# Patient Record
Sex: Female | Born: 1985 | Race: White | Hispanic: No | Marital: Married | Smoking: Former smoker
Health system: Southern US, Community
[De-identification: ages and names within clinical notes are randomized; demographics above are authoritative.]

## PROBLEM LIST (undated history)

## (undated) DIAGNOSIS — Q6589 Other specified congenital deformities of hip: Secondary | ICD-10-CM

## (undated) HISTORY — DX: Other specified congenital deformities of hip: Q65.89

---

## 2012-05-17 NOTE — L&D Delivery Note (Signed)
PROCEDURE NOTE  Birth Date: 09/08/2014   Birth Time: 09/08/2012 1108  Patient Name: Schnepp,Xiamara  Gestational Age: [redacted]w[redacted]d  GsPs: [id number]    Procedure:   Spontaneous Vaginal Delivery    Brief Notes:   Rupture date: Spontaneous  Rupture time: 09/08/12  Fluid color: Clear    SVD of viable  female "Rulon Eisenmenger" from OA to ROT. Nuchal cord x 1 easily reduced. Shoulders delivered easily.  Baby to maternal abdomen with spontaneous cry. 3 vessel cord double clamped then cut by FOB "Kathlene November". Cord blood not obtained d/t maternal blood type. Schultz placenta delivered spontaneously, complete and intact with normal cord insertion.  Fundus firming to massage at u. Pitocin 30 units in 500cc's infusing for good uterine response.Marland Kitchen  Perineum inspected, intact.   Mom and baby stable and bonding. Count complete and correct    APGAR:   1 Minute Apgar:     8   5 Minute Apgar:     9       Birth Weight:     6 lb 15.8 oz (3170 g)     Placenta Delivery Date:     09/08/2012    Placenta Delivery Time:     11:10 AM     Estimated Blood Loss:   250    Additional Notes:   Patient delivered baby without pushing. Placenta also delivered on its on with no traction.   Gender was a surprise    Providers Performing:   Bunnie Philips      Signed by: Bunnie Philips

## 2012-09-08 ENCOUNTER — Inpatient Hospital Stay: Payer: BLUE CROSS/BLUE SHIELD | Admitting: Obstetrics & Gynecology

## 2012-09-08 ENCOUNTER — Inpatient Hospital Stay: Payer: BLUE CROSS/BLUE SHIELD | Admitting: Anesthesiology

## 2012-09-08 ENCOUNTER — Encounter: Payer: Self-pay | Admitting: Anesthesiology

## 2012-09-08 ENCOUNTER — Inpatient Hospital Stay
Admission: AD | Admit: 2012-09-08 | Discharge: 2012-09-09 | DRG: 775 | Disposition: A | Discharge status: Home / Self Care | Payer: BLUE CROSS/BLUE SHIELD | Source: Ambulatory Visit | Attending: Obstetrics & Gynecology | Admitting: Obstetrics & Gynecology

## 2012-09-08 DIAGNOSIS — Q6589 Other specified congenital deformities of hip: Secondary | ICD-10-CM

## 2012-09-08 DIAGNOSIS — O360990 Maternal care for other rhesus isoimmunization, unspecified trimester, not applicable or unspecified: Principal | ICD-10-CM | POA: Diagnosis present

## 2012-09-08 HISTORY — DX: Other specified congenital deformities of hip: Q65.89

## 2012-09-08 LAB — TYPE AND SCREEN
AB Screen Gel: NEGATIVE
ABO Rh: AB NEG

## 2012-09-08 LAB — CBC AND DIFFERENTIAL
Basophils Absolute Automated: 0.01 (ref 0.00–0.20)
Basophils Automated: 0 %
Eosinophils Absolute Automated: 0.08 (ref 0.00–0.70)
Eosinophils Automated: 1 %
Hematocrit: 38.4 % (ref 37.0–47.0)
Hgb: 12.5 g/dL (ref 12.0–16.0)
Immature Granulocytes Absolute: 0.04
Immature Granulocytes: 0 %
Lymphocytes Absolute Automated: 1.79 (ref 0.50–4.40)
Lymphocytes Automated: 17 %
MCH: 26.7 pg — ABNORMAL LOW (ref 28.0–32.0)
MCHC: 32.6 g/dL (ref 32.0–36.0)
MCV: 82.1 fL (ref 80.0–100.0)
MPV: 10.9 fL (ref 9.4–12.3)
Monocytes Absolute Automated: 0.92 (ref 0.00–1.20)
Monocytes: 9 %
Neutrophils Absolute: 7.81 (ref 1.80–8.10)
Neutrophils: 74 %
Nucleated RBC: 0 (ref 0–1)
Platelets: 276 (ref 140–400)
RBC: 4.68 (ref 4.20–5.40)
RDW: 15 % (ref 12–15)
WBC: 10.61 (ref 3.50–10.80)

## 2012-09-08 LAB — GONOCOCCUS CULTURE
Chlamydia trachomatis Culture: NEGATIVE
Culture Gonorrhoeae: NEGATIVE

## 2012-09-08 LAB — HEPATITIS B SURFACE ANTIGEN W/ REFLEX TO CONFIRMATION: Hepatitis B Surface Antigen: NEGATIVE

## 2012-09-08 LAB — RUBELLA ANTIBODY, IGG: Rubella AB, IgG: IMMUNE

## 2012-09-08 LAB — HIV AG/AB 4TH GENERATION: HIV 1/2 Antibody: NONREACTIVE

## 2012-09-08 LAB — RPR: RPR: NONREACTIVE

## 2012-09-08 LAB — GROUP B STREP TRANSCRIBED: GBS Transcribed: NEGATIVE

## 2012-09-08 MED ORDER — PRENATAL AD PO TABS
1.0000 | ORAL_TABLET | Freq: Every day | ORAL | Status: DC
Start: 2012-09-08 — End: 2012-09-09
  Administered 2012-09-09: 1 via ORAL
  Filled 2012-09-08: qty 1

## 2012-09-08 MED ORDER — LACTATED RINGERS IV SOLN
INTRAVENOUS | Status: DC
Start: 2012-09-08 — End: 2012-09-08

## 2012-09-08 MED ORDER — ONDANSETRON HCL 4 MG/2ML IJ SOLN
8.0000 mg | Freq: Three times a day (TID) | INTRAMUSCULAR | Status: DC | PRN
Start: 2012-09-08 — End: 2012-09-08

## 2012-09-08 MED ORDER — FAMOTIDINE 10 MG/ML IV SOLN
20.0000 mg | Freq: Once | INTRAVENOUS | Status: DC | PRN
Start: 2012-09-08 — End: 2012-09-08

## 2012-09-08 MED ORDER — OXYTOCIN 30UNITS IN LR 500 ML PP--OUTSOURCED
7.5000 [IU]/h | INTRAVENOUS | Status: DC
Start: 2012-09-08 — End: 2012-09-09

## 2012-09-08 MED ORDER — METHYLERGONOVINE MALEATE 0.2 MG PO TABS
0.2000 mg | ORAL_TABLET | Freq: Four times a day (QID) | ORAL | Status: AC | PRN
Start: 2012-09-08 — End: 2012-09-09
  Filled 2012-09-08: qty 1

## 2012-09-08 MED ORDER — METHYLERGONOVINE MALEATE 0.2 MG/ML IJ SOLN
200.0000 ug | Freq: Four times a day (QID) | INTRAMUSCULAR | Status: AC | PRN
Start: 2012-09-08 — End: 2012-09-09

## 2012-09-08 MED ORDER — ACETAMINOPHEN 500 MG PO TABS
1000.0000 mg | ORAL_TABLET | ORAL | Status: DC | PRN
Start: 2012-09-08 — End: 2012-09-08

## 2012-09-08 MED ORDER — IBUPROFEN 600 MG PO TABS
600.0000 mg | ORAL_TABLET | Freq: Four times a day (QID) | ORAL | Status: DC
Start: 2012-09-08 — End: 2012-09-09
  Administered 2012-09-08 – 2012-09-09 (×3): 600 mg via ORAL
  Filled 2012-09-08 (×4): qty 1

## 2012-09-08 MED ORDER — LIDOCAINE-EPINEPHRINE 1.5 %-1:200000 IJ SOLN
30.0000 mL | Freq: Once | INTRAMUSCULAR | Status: DC
Start: 2012-09-08 — End: 2012-09-08

## 2012-09-08 MED ORDER — MISOPROSTOL 200 MCG PO TABS
800.0000 ug | ORAL_TABLET | Freq: Once | ORAL | Status: DC
Start: 2012-09-08 — End: 2012-09-09

## 2012-09-08 MED ORDER — LIDOCAINE-EPINEPHRINE 1.5 %-1:200000 IJ SOLN
INTRAMUSCULAR | Status: AC
Start: 2012-09-08 — End: 2012-09-08
  Filled 2012-09-08: qty 1

## 2012-09-08 MED ORDER — RHO D IMMUNE GLOBULIN 300 MCG IM INJ
300.00 ug | INJECTION | Freq: Once | INTRAMUSCULAR | Status: AC
Start: 2012-09-08 — End: 2012-09-09
  Administered 2012-09-09: 300 ug via INTRAMUSCULAR
  Filled 2012-09-08: qty 300

## 2012-09-08 MED ORDER — CITRIC ACID-SODIUM CITRATE 334-500 MG/5ML PO SOLN
30.0000 mL | Freq: Once | ORAL | Status: DC | PRN
Start: 2012-09-08 — End: 2012-09-08

## 2012-09-08 MED ORDER — TETANUS-DIPHTH-ACELL PERTUSSIS 5-2.5-18.5 LF-MCG/0.5 IM SUSP
0.5000 mL | INTRAMUSCULAR | Status: DC | PRN
Start: 2012-09-08 — End: 2012-09-09
  Filled 2012-09-08: qty 0.5

## 2012-09-08 MED ORDER — OXYCODONE-ACETAMINOPHEN 5-325 MG PO TABS
2.0000 | ORAL_TABLET | Freq: Once | ORAL | Status: DC | PRN
Start: 2012-09-08 — End: 2012-09-09
  Filled 2012-09-08: qty 2

## 2012-09-08 MED ORDER — ONDANSETRON HCL 4 MG PO TABS
8.0000 mg | ORAL_TABLET | Freq: Three times a day (TID) | ORAL | Status: DC | PRN
Start: 2012-09-08 — End: 2012-09-08
  Filled 2012-09-08: qty 1

## 2012-09-08 MED ORDER — OXYCODONE-ACETAMINOPHEN 5-325 MG PO TABS
2.0000 | ORAL_TABLET | ORAL | Status: DC | PRN
Start: 2012-09-08 — End: 2012-09-09
  Administered 2012-09-08: 2 via ORAL

## 2012-09-08 MED ORDER — CALCIUM CARBONATE ANTACID 500 MG PO CHEW
1000.0000 mg | CHEWABLE_TABLET | Freq: Three times a day (TID) | ORAL | Status: DC | PRN
Start: 2012-09-08 — End: 2012-09-09

## 2012-09-08 MED ORDER — FENTANYL 2 MCG/ML+BUPIVACAINE 0.125% 100 ML
EPIDURAL | Status: DC
Start: 2012-09-08 — End: 2012-09-08

## 2012-09-08 MED ORDER — NALOXONE HCL 0.4 MG/ML IJ SOLN
0.1000 mg | INTRAMUSCULAR | Status: DC | PRN
Start: 2012-09-08 — End: 2012-09-08

## 2012-09-08 MED ORDER — OXYTOCIN 10 UNIT/ML IJ SOLN
10.0000 [IU] | Freq: Once | INTRAMUSCULAR | Status: DC | PRN
Start: 2012-09-08 — End: 2012-09-09

## 2012-09-08 MED ORDER — ONDANSETRON 4 MG PO TBDP
8.0000 mg | ORAL_TABLET | Freq: Three times a day (TID) | ORAL | Status: DC | PRN
Start: 2012-09-08 — End: 2012-09-09
  Administered 2012-09-08: 8 mg via ORAL
  Filled 2012-09-08: qty 2

## 2012-09-08 MED ORDER — EPHEDRINE SULFATE 50 MG/ML IJ SOLN
10.0000 mg | Freq: Once | INTRAMUSCULAR | Status: DC | PRN
Start: 2012-09-08 — End: 2012-09-08

## 2012-09-08 MED ORDER — WITCH HAZEL-GLYCERIN EX PADS
1.0000 | MEDICATED_PAD | CUTANEOUS | Status: DC | PRN
Start: 2012-09-08 — End: 2012-09-09
  Filled 2012-09-08 (×2): qty 40

## 2012-09-08 MED ORDER — LIDOCAINE HCL 2 % IJ SOLN
5.0000 mL | Freq: Once | INTRAMUSCULAR | Status: DC
Start: 2012-09-08 — End: 2012-09-09

## 2012-09-08 MED ORDER — OXYTOCIN 30UNITS IN LR 500 ML PP--OUTSOURCED
7.5000 [IU]/h | INTRAVENOUS | Status: AC
Start: 2012-09-08 — End: 2012-09-08

## 2012-09-08 MED ORDER — BENZOCAINE-MENTHOL 20-0.5 % EX AERO
1.0000 | INHALATION_SPRAY | CUTANEOUS | Status: DC | PRN
Start: 2012-09-08 — End: 2012-09-09
  Administered 2012-09-09: 1 via TOPICAL
  Filled 2012-09-08 (×2): qty 56

## 2012-09-08 MED ORDER — MEASLES, MUMPS & RUBELLA VAC SC INJ
0.5000 mL | INJECTION | SUBCUTANEOUS | Status: DC | PRN
Start: 2012-09-08 — End: 2012-09-09
  Filled 2012-09-08: qty 0.5

## 2012-09-08 MED ORDER — TERBUTALINE SULFATE 1 MG/ML IJ SOLN
0.2500 mg | Freq: Once | INTRAMUSCULAR | Status: DC | PRN
Start: 2012-09-08 — End: 2012-09-08

## 2012-09-08 MED ORDER — LANOLIN EX OINT
TOPICAL_OINTMENT | CUTANEOUS | Status: DC | PRN
Start: 2012-09-08 — End: 2012-09-09
  Filled 2012-09-08: qty 7

## 2012-09-08 MED ORDER — LIDOCAINE-EPINEPHRINE 1.5 %-1:200000 IJ SOLN
INTRAMUSCULAR | Status: DC | PRN
Start: 2012-09-08 — End: 2012-09-08
  Administered 2012-09-08 (×2): 5 mL via EPIDURAL

## 2012-09-08 MED ORDER — HYDROCORTISONE 1 % EX OINT
TOPICAL_OINTMENT | Freq: Three times a day (TID) | CUTANEOUS | Status: DC | PRN
Start: 2012-09-08 — End: 2012-09-09
  Filled 2012-09-08: qty 28.35

## 2012-09-08 MED ORDER — METOCLOPRAMIDE HCL 5 MG/ML IJ SOLN
10.0000 mg | Freq: Once | INTRAMUSCULAR | Status: DC | PRN
Start: 2012-09-08 — End: 2012-09-08

## 2012-09-08 MED ORDER — OXYTOCIN 30UNITS IN LR 500 ML PP--OUTSOURCED
INTRAVENOUS | Status: AC
Start: 2012-09-08 — End: 2012-09-08
  Filled 2012-09-08: qty 500

## 2012-09-08 MED ORDER — DOCUSATE SODIUM 100 MG PO CAPS
200.0000 mg | ORAL_CAPSULE | Freq: Two times a day (BID) | ORAL | Status: DC
Start: 2012-09-08 — End: 2012-09-09
  Administered 2012-09-08: 200 mg via ORAL
  Filled 2012-09-08: qty 2

## 2012-09-08 MED ORDER — LACTATED RINGERS IV BOLUS
1000.0000 mL | Freq: Once | INTRAVENOUS | Status: DC
Start: 2012-09-08 — End: 2012-09-08

## 2012-09-08 MED ORDER — FENTANYL 2 MCG/ML+BUPIVACAINE 0.125% 100 ML
EPIDURAL | Status: AC
Start: 2012-09-08 — End: 2012-09-08
  Administered 2012-09-08: 12 mL/h via EPIDURAL
  Filled 2012-09-08: qty 100

## 2012-09-08 MED ORDER — IBUPROFEN 600 MG PO TABS
600.0000 mg | ORAL_TABLET | Freq: Once | ORAL | Status: DC | PRN
Start: 2012-09-08 — End: 2012-09-09

## 2012-09-08 NOTE — Plan of Care (Signed)
Pt vital signs stable afebrile. Tolerating regular diet. Uterine cramping relieved with Percocet and ibuprofen.  Assisted with cross cradle hold for breastfeeding.

## 2012-09-08 NOTE — Anesthesia Procedure Notes (Signed)
Epidural    Patient location during procedure: L&D  Reason for block: maternal indication  Start time: 09/08/2012 9:39 AM  End time: 09/08/2012 9:55 AM  Staffing  Performed by: anesthesiologist Preanesthetic Checklist Completed: patient identified, surgical consent, pre-op evaluation, timeout performed, risks and benefits discussed, monitors and equipment checked, anesthesia consent given and correct site  Timeout Completed:  09/08/2012 9:35 AM    Epidural    Patient monitoring: Pulse oximetry and NIBP      Patient position: sitting  Sterile Technique: Betadine  Skin Local: lidocaine 1%      Interspace: L3-4  Number of attempts: 1    Approach: midline  Needle type: Touhy needle       Injection technique: LOR saline  Epidural Space ID: 6 cm        Needle type: Touhy needle                         Catheter at skin depth: 11 cm      Test Dose:1.5 % lidocaine      No Catheter IV/SA Signs or SymptomsAssessment   Block Outcome: patient tolerated procedure wellBlock at Surgeon's request: Yes

## 2012-09-08 NOTE — Anesthesia Preprocedure Evaluation (Signed)
Anesthesia Plan    ASA 3     epidural                                 informed consent obtained

## 2012-09-08 NOTE — Progress Notes (Signed)
Pt given regular tray- tol well- denies need for pain med @ this time

## 2012-09-08 NOTE — Progress Notes (Signed)
Pt states she has 'hip dislocations - and they are out all the time'

## 2012-09-08 NOTE — Progress Notes (Signed)
report to [employer] rn and transferred to rm 2014

## 2012-09-08 NOTE — H&P (Signed)
09/08/2012 9:21 AM     Deborah Warren is a 27 y.o. year old female who presents for evaluation to r/o labor.  Called [place], CNM c/o ctx q 10, instructed to continue to labor. Called 1 hour later stating ctx q 5, to L&D for eval. Denies bleeding, LOF, or decreased fetal movement.    History     Problems with present pregnancy:  Congenital hip displasia  BMI >35  RH negative     OB History:   OB History     Grav Para Term Preterm Abortions TAB SAB Ect Mult Living    3 1 1  1  1   1           Allergies: Review of patient's allergies indicates no known allergies.    Current medications: None    Gestational age: [redacted]w[redacted]d    Previous Pregnancy Problems:   Last baby 9#11ounces. No complications with delivery    Other significant prenatal, medical, or family history:    History reviewed. No pertinent past medical history.  History reviewed. No pertinent past surgical history.    Prenatal labs:  GBS negative  AB negative  Immune    Physician/CNM admission/Physical Examination     Vital Signs:   Filed Vitals:    09/08/12 0851   BP: 114/61   Pulse: 71   Temp: 98.2 F (36.8 C)   Resp: 20     General Appearance: Alert, appropriate appearance for age. Breathing and wincing with contractions    Membranes: intact    Cervical Exam:  Dilation: 5  Effacement (%): 80  Cervical Characteristics: Anterior  Presentation: Vertex  OB Examiner: Manoah Deckard    FHT: 130  Variability: moderate  Contractions: irregular, every 5 minutes  Categories: Category I    Proposed Plan of Care   NST  Admit to L&D  CBC and Type & Screen  Epidural PRN  Discussed plan of care with patient and FOB "Kathlene November".  Agree to plan of care.     Bunnie Philips, CNM   09/08/2012 9:21 AM

## 2012-09-08 NOTE — Progress Notes (Signed)
OB PROGRESS NOTE    Date Time: 09/08/2012 10:48 AM  Patient Name: Deborah Warren,Deborah Warren    Subjective:   After SROM, increase in pressure    Objective:     Filed Vitals:    09/08/12 1015   BP: 110/62   Pulse: 72   Temp:    Resp:    SpO2: 100%     Cervical Exam:  Dilation: Lip/rim (Comment)  Effacement (%): 80  Cervical Characteristics: Anterior  Presentation: Vertex  OB Examiner: j. Timotheus Salm     Membranes: ruptured, clear fluid  FHT: 130  Variability: moderate  Contractions: regular, every 2-3 minutes  Categories: Category I    Medications: Epidural    Assessment:   IUP at 40.5 weeks; Active labor    Plan:   Patient desires to rest now that she is comfortable and not yet feeling urge to push      Signed by: Bunnie Philips 09/08/2012

## 2012-09-09 LAB — RH IMM GLOBULIN

## 2012-09-09 LAB — FETAL BLEED SCREEN: Fetal Bleed Screen: NEGATIVE

## 2012-09-09 MED ORDER — IBUPROFEN 600 MG PO TABS
600.0000 mg | ORAL_TABLET | Freq: Four times a day (QID) | ORAL | Status: DC | PRN
Start: 2012-09-09 — End: 2020-10-09

## 2012-09-09 NOTE — Anesthesia Postprocedure Evaluation (Signed)
Anesthesia Post Evaluation    Patient: Deborah Warren    Procedures performed: * SVD *    Anesthesia type: Epidural    Patient location: 3 OB    Last vitals:   Filed Vitals:    09/09/12 0805   BP: 129/66   Pulse: 76   Temp: 99.8 F (37.7 C)   Resp: 18   SpO2:        Post pain: Patient not complaining of pain, continue current therapy      Mental Status:awake    Respiratory Function: tolerating room air    Cardiovascular: stable    Nausea/Vomiting: patient not complaining of nausea or vomiting    Hydration Status: adequate    Post assessment: no apparent anesthetic complications

## 2012-09-09 NOTE — Addendum Note (Signed)
Addendum  created 09/09/12 1443 by Bertha Stakes, MD    Modules edited:Notes Section

## 2012-09-09 NOTE — Discharge Instructions (Signed)
After Your Delivery Discharge Instructions    After Discharge Orders:  Follow up in 6 weeks     After your delivery - signs and symptoms to watch for:   Fever - Oral temperature greater than 100.4 degrees Fahrenheit   Foul-smelling vaginal discharge   Headache unrelieved by "pain meds"   Difficulty urinating   Breasts reddened, hard, hot to the touch   Nipple discharge which is foul-smelling or contains pus   Increased pain at the site of the laceration   Difficulty breathing with or without chest pain   New calf pain especially if only on one side   Sudden, continuing increased vaginal bleeding with or without clots   Unrelieved feelings of:   Inability to cope   Sadness   Anxiety   Lack of interest in baby   Insomnia   Crying     What to do at home:   See patient education handouts for full information   Resume activity gradually    Don't lift anything heavier than baby and carrier until OK'd by your Physician or Midwife   No sex until OK'd by your Physician or Midwife   Take care of yourself by sleeping/resting as much as possible   Eat regular nutritious meals   Let someone else care for you, your baby, and housework as much as possible    Take pain medication as prescribed whenever you need them   Wear compression stockings if prescribed    To avoid/relieve constipation take stool softeners if advised    Drink lots of water/fruit juices   Increase fiber in your diet   Breast care: Wear support bra 24/7; use Lanolin as needed     Refer to Newborn Discharge Instructions for any concerns

## 2012-09-09 NOTE — Plan of Care (Signed)
Pt vital signs stable afebrile. RN assisted with cross cradle hold for breastfeeding. Pt had nausea and vomiting yesterday evening, resolved; tolerating regular diet. Rhogam given prior to discharge. Pt given discharge instructions. Pt stated she understood. Discharged at this time, Pt to ,lobby via wheelchair accompanied by spouse and [facility] staff.

## 2012-09-09 NOTE — Discharge Summary (Signed)
[facility] POSTPARTUM DISCHARGE SUMMARY    Subjective:  Delivery Type: Vaginal  Minimal Bleeding, breastfeeding well  Baby doing well  Ready for discharge today    Objective:  Vital Signs Stable;   Filed Vitals:    09/08/12 2022   BP: 118/68   Pulse: 70   Temp: 98.4 F (36.9 C)   Resp: 18   SpO2:      Breast soft, non tender;   Fundus firm, non tender;  Extremities WNL    Assessment/Plan:  Stable - Postpartum Day 1  Discharge home  Follow up in 6 weeks  Call with fever, increasing and severe pain, heavy bleeding, depression, any questions or concerns  Take Ibuprofen PRN  PNV daily  Written and verbal instructions given    Signed by: Suzanna Obey, CNM 09/09/2012

## 2012-09-09 NOTE — Progress Notes (Signed)
09/09/12 0850   Lactation Consultation   Visit Type  Initial Assessment   Maternal Information   Breastfeeding Status YES   Previous Breastfeeding Experience Yes    In room visit. Mom holding sleeping baby. Mom is a P2. Mom had many struggles with breast feeding her first child. Mom newest son is latching and breast feeding much better. Mom is pleased. Discussed community resources available after D/C.

## 2012-09-10 ENCOUNTER — Other Ambulatory Visit: Payer: BLUE CROSS/BLUE SHIELD

## 2020-02-11 ENCOUNTER — Telehealth: Payer: BLUE CROSS/BLUE SHIELD | Admitting: Family Medicine

## 2020-02-11 ENCOUNTER — Encounter (INDEPENDENT_AMBULATORY_CARE_PROVIDER_SITE_OTHER): Payer: Self-pay | Admitting: Family Medicine

## 2020-02-11 ENCOUNTER — Telehealth (INDEPENDENT_AMBULATORY_CARE_PROVIDER_SITE_OTHER): Payer: Self-pay | Admitting: Family Medicine

## 2020-02-11 DIAGNOSIS — Z09 Encounter for follow-up examination after completed treatment for conditions other than malignant neoplasm: Secondary | ICD-10-CM

## 2020-02-11 DIAGNOSIS — J069 Acute upper respiratory infection, unspecified: Secondary | ICD-10-CM

## 2020-02-11 DIAGNOSIS — Q6589 Other specified congenital deformities of hip: Secondary | ICD-10-CM | POA: Insufficient documentation

## 2020-02-11 DIAGNOSIS — Z8709 Personal history of other diseases of the respiratory system: Secondary | ICD-10-CM

## 2020-02-11 NOTE — Patient Instructions (Signed)
Coronavirus Disease 2019 (COVID-19): Overview  Coronavirus disease 2019 (COVID-19) is a respiratory illness. It's caused by a new (novel) coronavirus. There are many types of coronavirus. Coronaviruses are a very common cause of colds and bronchitis. They may sometimes cause lung infection (pneumonia). Symptoms can range from mild to severe. Some people have no symptoms. These viruses are also found in some animals.  All 50 states in the [place] have reported cases of COVID-19. Many areas report "community spread" of COVID-19. This means the source of the illness is not known. Like other viruses, the virus that causes COVID-19 changes (mutates) all the time. This leads to variants. Many variants of the COVID-19 virus have been found across the world, including in the [place] COVID-19 variants may spread more easily from person to person. They may cause milder or more severe symptoms. COVID-19 is a rapidly-emerging infectious disease. This means that scientists are actively researching it. There are information updates regularly.  Public health officials are working to find the source. How the virus spreads is not yet fully understood, but it seems to spread and infect people fairly easily. Some people who have been infected in an area may not be sure how or where they were infected. The virus may be spread through droplets of fluid that a person coughs or sneezes into the air. It may be spread if you touch a surface with the virus on it, such as a handle or object, and then touch your eyes, nose, or mouth.     To help prevent spreading the infection, wash your hands often, or use an alcohol-based hand sanitizer.   For the latest information, visit the CDC website at www.cdc.gov/coronavirus/2019-ncov. Or call 800-CDC-INFO (800-232-4636).  What are the symptoms of COVID-19?  Some people have no symptoms or mild symptoms. Symptoms can also vary from person to person. As experts learn more about COVID-19, other symptoms are  being reported. Symptoms may appear 2 to 14 days after contact with the virus:  · Fever or chills  · Coughing  · Trouble breathing or feeling short of breath  · Sore throat  · Stuffy or runny nose  · Headache and body aches  · Fatigue  · Nausea, vomiting, diarrhea, or abdominal pain  · New loss of sense of smell or taste  You can check your symptoms with the CDC’s Coronavirus Self-Checker.  What are possible complications from COVID-19?  In many cases, this virus can cause infection (pneumonia) in both lungs. In some cases, this can cause death. Certain people are at higher risk for complications. This includes older adults and people with serious chronic health conditions such as heart or lung disease, diabetes, or kidney disease. It includes people with health conditions that suppress the immune system. And it includes people taking medicines that suppress the immune system.  As experts learn more about COVID-19, other complications are being reported that may be linked to COVID-19. Rarely, some children have developed severe complications called multisystem inflammatory syndrome in children (MIS-C). MIS-C seems to be similar to [place] disease, a rare condition causing inflammation of blood vessels and body organs. It's not yet known if MIS-C happens only in children, or if adults are also at risk. It's also not known if it's related to COVID-19, because many children, but not all, have tested positive for the virus. Experts continue to study MIS-C. The CDC advises healthcare providers to report to local health departments any person under age 21 years old who is ill enough to be in   the hospital and has all of the following:  · A fever over 100.4°F (38.0°C) for more than 24 hours and a positive SARS-CoV-2 test or exposure to the virus in the last 4 weeks  · Inflammation in at least 2 organs such as the heart, lungs, or kidneys with lab tests that show inflammation  · No other diagnoses besides COVID-19 explain  the child's symptoms  How is COVID-19 diagnosed?  Your healthcare provider will ask about your symptoms. He or she will ask where you live, and about your recent travel, and any contact with sick people. If your healthcare provider thinks you may have COVID-19, he or she will consider whether to test you for COVID-19. This depends on the availability of testing in your area, and how sick you are. Follow all instructions from your healthcare provider. Guidelines for testing may change as more information about the virus becomes available. Diagnostic tests are used to find a current COVID-19 infection. These include:  · Viral (molecular) test.  You may also hear this called a RT-PCR test. Viral tests are very accurate. A viral test looks for the SARS-CoV-2 virus's genetic material. A viral test can also detect COVID-19 variants. There are a few ways to do this. A nose-throat swab may be wiped inside your nose to the very back of your throat. Other tests are either done by nose or throat swab. Or a sample of your saliva may be taken. Availability of tests vary by location. Some test kits can be done at home but must be sent to a lab to be checked. Depending on the test, some results are back within about 30 minutes. Some tests must be sent to a lab and can take several days before the results are back. Home test kits are now available but vary by location, and some need a prescription. If you use a home kit, follow the instructions in the kit closely. Some kits get results quickly at home. Others must be sent to a lab for the results.  · Antigen test. This can find proteins from the SARS-CoV-2 virus. This is done by a nose or a nose-throat swab. Depending on the test, some results are back within an hour. Positive results are highly accurate, but false positives can happen, especially in places where few people have the virus. Antigen tests are more likely to miss a COVID-19 infection than a viral (molecular) test. If  your antigen test is negative but you have symptoms of COVID-19, your healthcare provider may order a viral test.  If your healthcare provider thinks or confirms that you have COVID-19, you may have other tests. These tests may include:  · Antibody blood test.  Antibody tests are being looked at to find out if a person has previously been infected with the virus and may now have antibodies such as SARS AB IgG in their blood to give some immunity. The accuracy and availability of antibody tests vary. An antibody test may not be able to show if you have a current infection because it can take up to a few weeks after infection to make antibodies. It's not yet known how long immunity lasts after being infected with the virus.  · Sputum culture.  A small sample of mucus coughed from your lungs (sputum) may be collected if you have a moist cough. It may be checked for the virus or to look for pneumonia.  · Imaging tests.  You may have a chest X-ray or CT scan.    Note about reinfection and your immunity  At this time, it's unclear if people can be reinfected with COVID-19. The CDC notes that if a person has fully recovered from COVID-19 and is retested within 3 months of the first infection, they may continue to have low levels of the virus in their body and test positive for COVID-19, even though they are not spreading COVID-19. Having a positive COVID-19 test after an infection doesn't mean you can't be reinfected. It's not yet known how long immunity lasts after being infected with the virus.  How is COVID-19 treated?  The FDA has approved vaccines to prevent COVID-19 in people older than 18 (one vaccine has been approved for people as young as 16). Pregnant or breastfeeding people may choose to be vaccinated. Expert groups, including ACOG and the CDC, advise pregnant or breastfeeding people to talk with their healthcare provider about the vaccine.  The vaccines are being rolled out to the public in phases. Check your  local health department for your community's roll-out plans. The vaccines are given as a shot (injection) in the arm muscle. A 1-dose or 2-dose vaccine may be given. If you get the 2-dose vaccine, the second dose is given several weeks after the first. Getting the COVID-19 vaccine is important to help prevent the spread of COVID-19 and its variants.  The most proven treatments right now are those to help your body while it fights the virus. This is known as supportive care. For serious COVID-19, you may need to stay in the hospital. Supportive care may include:  · Getting rest.  This helps your body fight the illness.  · Staying hydrated.  Drinking liquids is the best way to prevent dehydration. Try to drink 6 to 8 glasses of liquids every day, or as advised by your provider. Also check with your provider about which fluids are best for you. Don't drink fluids that contain caffeine or alcohol.  · Taking over-the-counter (OTC) pain medicine.  These are used to help ease pain and reduce fever. Follow your healthcare provider's instructions for which OTC medicine to use.  For severe illness, you may need to stay in the hospital. Care during severe illness may include:  · IV (intravenous) fluids.  These are given through a vein to help keep your body hydrated.  · Oxygen. You may be given supplemental oxygen or ventilation with a breathing machine (ventilator). This is done so you get enough oxygen in your body.  · Prone positioning.  Depending on how sick you are during your hospital stay, your healthcare team may turn you regularly on your stomach. This is called prone positioning. It helps increase the amount of oxygen you get to your lungs. Follow your healthcare team's instructions on position changes while you're in the hospital. Also follow their discharge advice on the best positions to help your breathing once you go home.  · Remdesivir. The FDA has approved an IV (intravenous) antiviral medicine called  remdesivir. It works by stopping the spread of the SARS-CoV-2 virus in the body. It's approved for people in the hospital. It's for people 12 years and older who weigh more than about 88 pounds (40 kgs). Remdesivir is approved only for people who need to be treated in the hospital. In certain cases, it may also be used for people younger than 12 years or who weigh less than about 88 pounds (40 kgs).  Research continues on other therapies that are still experimental. These include:  · COVID-19 convalescent plasma.    People who have had COVID-19 and are fully recovered may be asked by their healthcare team to consider donating plasma. This is called COVID-19 convalescent plasma donation. Plasma from people fully recovered from COVID-19 may contain antibodies to help fight COVID-19 in people who are currently seriously ill with the disease. Experts don't know if the donated plasma will work well as a treatment. Research continues, and the FDA has approved it for emergency use in certain people with serious or life-threatening COVID-19. Talk with your provider to learn more about convalescent plasma donation and whether you qualify to donate.  · Monoclonal antibody therapy.  The FDA recently approved this experimental therapy for emergency use for certain people who have a positive COVID-19 viral test and have mild to moderate symptoms but are not in the hospital. It's not widely available and is still being investigated. It's approved for people 12 years and older who weigh about 88 pounds (40 kgs) and are at high risk for severe COVID-19 and a hospital stay. This includes people who are 65 years and older and people with certain chronic conditions. Monoclonal antibody therapy is not approved for people who:  ? Are in the hospital with COVID-19, or  ? Need oxygen therapy for COVID-19,  or  ? Need oxygen therapy for a chronic condition and need to have their oxygen flow rate increased because of COVID-19.     Are you at  risk for COVID-19?  You are at risk for COVID-19 if you have had close contact with someone with the virus, or if you live in or traveled to an area with cases of it. Close contact means being within 6 feet of a person known to have COVID-19 for a total of 15 minutes or more. This could be multiple short encounters that add up to at least 15 minutes over a 24-hour period. Recent studies suggest that COVID-19 may be spread by people who are not showing symptoms.  Date last modified: 08/20/2019   [employer] last reviewed this educational content on 05/17/2018  © 2000-2021 [employer]. All rights reserved. This information is not intended as a substitute for professional medical care. Always follow your healthcare professional's instructions.

## 2020-02-11 NOTE — Progress Notes (Signed)
[facility]    Subjective:      Date: 02/11/2020 12:05 PM   Patient ID: Deborah Warren is a 34 y.o. female.    Verbal consent has been obtained from the patient to conduct a video and telephone visit: yes    Chief Complaint:  Chief Complaint   Patient presents with    Follow-up     COVID symptoms       HPI:  HPI  Son tested positive for COVID after exposure.  Today is her last day of quarantine period  Last week had symptoms (sneezing, congestion and sore throat, fatigue, sob, cough, fever, loss of sense of smell/taste), but now much better  Has not been tested because her son tested positive and she had similar symptoms.  Had discussed with health department  COVID vaccine status: UTD         Problem List:  Patient Active Problem List   Diagnosis    Congenital hip dysplasia       Current Medications:  Current Outpatient Medications   Medication Sig Dispense Refill    ibuprofen (ADVIL,MOTRIN) 600 MG tablet Take 1 tablet (600 mg total) by mouth every 6 (six) hours as needed for Fever (mild/moderate pain). 30 tablet 1     No current facility-administered medications for this visit.       Allergies:  No Known Allergies    Past Medical History:  Past Medical History:   Diagnosis Date    Congenital hip dysplasia        Past Surgical History:  History reviewed. No pertinent surgical history.    Family History:  History reviewed. No pertinent family history.    Social History:  Social History     Socioeconomic History    Marital status: Married     Spouse name: Not on file    Number of children: Not on file    Years of education: Not on file    Highest education level: Not on file   Occupational History    Not on file   Tobacco Use    Smoking status: Former Smoker     Quit date: 04/16/2006     Years since quitting: 13.8    Smokeless tobacco: Never Used    Tobacco comment: 0.5 PPD for 8 years   Substance and Sexual Activity    Alcohol use: No    Drug use: No    Sexual activity: Yes      Partners: Male   Other Topics Concern    Not on file   Social History Narrative    Not on file     Social Determinants of Health     Financial Resource Strain:     Difficulty of Paying Living Expenses:    Food Insecurity:     Worried About Programme researcher, broadcasting/film/video in the Last Year:     Barista in the Last Year:    Transportation Needs:     Freight forwarder (Medical):     Lack of Transportation (Non-Medical):    Physical Activity:     Days of Exercise per Week:     Minutes of Exercise per Session:    Stress:     Feeling of Stress :    Social Connections:     Frequency of Communication with Friends and Family:     Frequency of Social Gatherings with Friends and Family:     Attends Religious Services:     Active Member  of Clubs or Organizations:     Attends Engineer, structural:     Marital Status:    Intimate Partner Violence:     Fear of Current or Ex-Partner:     Emotionally Abused:     Physically Abused:     Sexually Abused:        The following sections were reviewed this encounter by the provider:   Tobacco   Allergies   Meds   Problems   Med Hx   Surg Hx   Fam Hx          ROS:  Review of Systems   Constitutional: Negative for chills, fatigue and fever.   HENT: Negative for congestion, ear pain, postnasal drip, rhinorrhea, sinus pain, sneezing and sore throat.    Respiratory: Negative for cough and shortness of breath.    Cardiovascular: Negative for chest pain.   Neurological: Negative for headaches.        Objective:     Physical Exam:  Physical Exam  Nursing note reviewed.   Constitutional:       Appearance: Normal appearance.   HENT:      Mouth/Throat:      Mouth: Mucous membranes are moist.   Eyes:      Extraocular Movements: Extraocular movements intact.   Pulmonary:      Effort: Pulmonary effort is normal.   Neurological:      Mental Status: She is alert.   Psychiatric:         Mood and Affect: Mood normal.         Assessment/Plan:       1. Acute URI  URI:  Status:  resolved  Treatment recommendations: Symptoms likely due to COVID due to her exposure to her son who tested positive. Answered questions regarding COVID-19 and CDC guidelines on isolation. She is on day 10 of isolation today and meets criteria to end isolation at the end of today.    Return in about 4 weeks (around 03/10/2020) for Annual Physical.    Virgilio Frees, MD

## 2020-02-11 NOTE — Telephone Encounter (Signed)
Appointment scheduled from MyChart   [name]   Sent: ZHY February 09, 2020 3:14 PM   To: Macky [facility] Desk    Jermeka Schlotterbeck   MRN: [id number] DOB: [birth year]   Pt Home: [phone]     Entered: [phone]        Message    Appointment For: Deborah Warren ([id number])   Visit Type: VIDEO VISIT NEW PATIENT [phone])      02/11/2020  11:45 AM 30 mins. Virgilio Frees, MD  [facility]      Patient Comments:   R: Illness - Other,Seen PCP:No, [place]:Yes, Appt Type:Video Visit, DOB:Aug 24, 1985, I: [employer] FEP (Basic),S: [phone], Language:N/A,    Appointment Scheduled    Mychart, Generic  Deborah Warren and proxy Leida Lauth) 23 hours ago (7:02 AM)   GM  Appointment Information:    Visit Type:     Video Visit  Provider:       Virgilio Frees, MD  Date:             02/11/20  Time:                      11:45 AM  Arrival Time:    11:30 AM EDT    Department:              [facility]    Appointment Instructions:     (Links will only appear as plain text. For optimal viewing of this message, please view your MyChart appointment details to access any active links)      Please read the detailed instructions on how to prepare for your video visit and to ensure that your device meets the technology requirements.      At least 24 hours before your appointment  Visit our  "Getting Started" section   View our instructional videos   If using a mobile device, ensure you have downloaded your MyChart app  Update your browser settings (see version information here.)  Charge your device    At least 15 minutes before your appointment time  Log into your MyChart account  Go to the Appointments tab   Select your video visit appointment   Complete the eCheck-in process  Click Begin Video Visit  When prompted, allow access to your camera and microphone  Click Join  Please wait for your provider and do not disconnect    If we  notice you are having problems connecting to your visit, a member from your care team may reach out to assist you.  They may contact you by phone, or they may send communication by email or text.      Thanks for scheduling your video visit with [facility]. We look forward to serving your healthcare needs.  [email or web address]  [email or web address]).  At [facility], safety is more than a checklist  its part of our culture, the foundation of everything we do, every time, every touch.    Additional Patient Resources   Read our Patient Acknowledgement   If you have any questions prior to your visit, please send a message via MyChart account or call the clinic.             Please click [email or web address] to view more details about your appointment.         This MyChart message has not been read.   Mychart, Generic  Deborah Warren and proxy SHERLENE RICKEL) 23 hours ago (7:01 AM)  GM  Appointment Information:    Visit Type:     Video Visit  Provider:       Virgilio Frees, MD  Date:             02/11/20  Time:                      11:45 AM  Arrival Time:    11:30 AM EDT    Department:              [facility]  [address]  [place] [address]  Dept: [phone]  Dept Fax: [phone]      Please click [email or web address] to review and complete any questionnaires that are available before your appointment.     Mychart, Generic  Deborah Warren 2 days ago   GM  Appointment Information:    Visit Type:     Video Visit  Provider:     Virgilio Frees, MD  Date:         02/11/20  Time:           11:45 AM  Arrival Time:    11:30 AM EDT    Department:           [facility]    Appointment Instructions:     (Links will only appear as plain text. For optimal viewing of this message,   please view your MyChart appointment details to access any active links)      Please read the detailed instructions  on how to prepare for your video visit   and to ensure that your device meets the technology requirements.      At least 24 hours before your appointment  Visit our  "Getting Started" section   View our instructional videos   If using a mobile device, ensure you have downloaded your MyChart app  Update your browser settings (see version information here.)  Charge your device    At least 15 minutes before your appointment time  Log into your MyChart account  Go to the Appointments tab   Select your video visit appointment   Complete the eCheck-in process  Click Begin Video Visit  When prompted, allow access to your camera and microphone  Click Join  Please wait for your provider and do not disconnect    If we notice you are having problems connecting to your visit, a member from   your care team may reach out to assist you.  They may contact you by phone, or   they may send communication by email or text.      Thanks for scheduling your video visit with [place]. We look forward to serving   your healthcare needs.  [email or web address]  [email or web address]).  At   [facility], safety is more than a checklist  its part of our culture, the   foundation of everything we do, every time, every touch.    Additional Patient Resources   Read our Patient Acknowledgement   If you have any questions prior to your visit, please send a message via   MyChart account or call the clinic.             Please click [email or web address] to view more details about your   appointment.         This MyChart message has not been read.   Deborah Warren  Patient Appointment Schedule Request Pool 2 days ago   JC  Appointment For:  Deborah Warren ([id number])  Visit Type: VIDEO VISIT NEW PATIENT [phone])    02/11/2020   11:45 AM  30 mins.  Virgilio Frees, MD    [facility]    Patient Comments:  R: Illness - Other,Seen PCP:No, [place]:Yes, Appt Type:Video Visit, DOB:07/16/1985, I:  [employer] FEP (Basic),S: [phone], Language:N/A,

## 2020-02-15 ENCOUNTER — Telehealth (INDEPENDENT_AMBULATORY_CARE_PROVIDER_SITE_OTHER): Payer: BLUE CROSS/BLUE SHIELD | Admitting: Internal Medicine

## 2020-05-14 NOTE — Progress Notes (Signed)
Subjective:      Patient ID: Deborah Warren is a 34 y.o. female.    Chief Complaint:  Chief Complaint   Patient presents with    Annual Exam     Patient is fasting       HPI  Visit Type: Health Maintenance Visit  Work Status: working full-time   Reported Health: good health  Reported Diet: compliant with well-balanced diet  Reported Exercise: recently not as regularly  Dental: regular dental visits twice a year  Vision: glasses and regular eye exams   Hearing: normal hearing  Immunization Status: Tdap vaccination due  Reproductive Health: sexually active, premenopausal and LNMP:  April 28, 2020  Prior Screening Tests: pap smear within past 3-5 years  General Health Risks: no family history of colon cancer and no family history of breast cancer  Safety Elements Used: uses seat belts, smoke detectors in household, does not text and drive, guns at home, gun safe and gun locks  Depression Screening: Little interest or pleasure in doing things: 0 (05/15/2020 12:01 AM)  Feeling down, depressed, or hopeless: 0 (05/15/2020 12:01 AM)  PHQ Total Score: 0 (05/15/2020 12:01 AM)      Problem List:  Patient Active Problem List   Diagnosis    Congenital hip dysplasia    Class 2 obesity due to excess calories without serious comorbidity with body mass index (BMI) of 39.0 to 39.9 in adult       Current Medications:  No outpatient medications have been marked as taking for the 05/15/20 encounter (Office Visit) with Virgilio Frees, MD.       Allergies:  No Known Allergies    Past Medical History:  Past Medical History:   Diagnosis Date    Congenital hip dysplasia        Past Surgical History:  History reviewed. No pertinent surgical history.    Family History:  Family History   Problem Relation Age of Onset    No known problems Mother     Brain Aneurysm Father        Social History:  Social History     Tobacco Use    Smoking status: Former Smoker     Quit date: 04/16/2006     Years since quitting: 14.0    Smokeless  tobacco: Never Used    Tobacco comment: 0.5 PPD for 8 years   Vaping Use    Vaping Use: Never used   Substance Use Topics    Alcohol use: No    Drug use: No         The following sections were reviewed this encounter by the provider:   Tobacco   Allergies   Meds   Problems   Med Hx   Surg Hx   Fam Hx            Review of Systems   Constitutional: Negative for chills, fatigue and fever.   HENT: Negative for congestion, ear pain, rhinorrhea and sore throat.    Eyes: Negative for visual disturbance.   Respiratory: Negative for cough, shortness of breath and wheezing.    Cardiovascular: Negative for chest pain, palpitations and leg swelling.   Gastrointestinal: Negative for abdominal pain, blood in stool, diarrhea, nausea and vomiting.   Genitourinary: Negative for difficulty urinating, dysuria, hematuria, menstrual problem, vaginal bleeding, vaginal discharge and vaginal pain.   Musculoskeletal: Negative for arthralgias and myalgias.   Skin: Negative for rash.   Neurological: Negative for dizziness, weakness, light-headedness, numbness and  headaches.   Hematological: Negative for adenopathy.   Psychiatric/Behavioral: Negative for confusion.        Objective:   Vitals:  BP 116/60 (BP Site: Left arm, Patient Position: Sitting, Cuff Size: Medium)    Pulse 69    Temp 97.7 F (36.5 C) (Temporal)    Ht 1.54 m (5' 0.63")    Wt 94.4 kg (208 lb 3.2 oz)    LMP 04/28/2020 (Exact Date)    SpO2 99%    Breastfeeding Yes    BMI 39.82 kg/m     Physical Exam  Vitals reviewed.   Constitutional:       Appearance: Normal appearance. She is obese.   HENT:      Head: Normocephalic.      Nose: Nose normal.      Mouth/Throat:      Mouth: Mucous membranes are moist.   Eyes:      Extraocular Movements: Extraocular movements intact.      Pupils: Pupils are equal, round, and reactive to light.   Cardiovascular:      Rate and Rhythm: Normal rate and regular rhythm.      Pulses: Normal pulses.      Heart sounds: Normal heart sounds. No murmur  heard.      Pulmonary:      Effort: Pulmonary effort is normal. No respiratory distress.      Breath sounds: Normal breath sounds. No wheezing or rales.   Abdominal:      General: Bowel sounds are normal. There is no distension.      Palpations: Abdomen is soft.      Tenderness: There is no abdominal tenderness.   Musculoskeletal:         General: Normal range of motion.      Cervical back: Normal range of motion.   Lymphadenopathy:      Cervical: No cervical adenopathy.   Skin:     General: Skin is warm.   Neurological:      General: No focal deficit present.      Mental Status: She is alert.      Cranial Nerves: No cranial nerve deficit.   Psychiatric:         Mood and Affect: Mood normal.          Assessment/Plan:       1. Annual physical exam    2. Class 2 obesity due to excess calories without serious comorbidity with body mass index (BMI) of 39.0 to 39.9 in adult    3. Screening for diabetes mellitus  - Comprehensive metabolic panel    4. Screening for hyperlipidemia  - Lipid panel    5. Immunization due  - Tdap vaccine greater than or equal to 7yo IM      Health Maintenance:  High BMI follow-up: Encouragement to Exercise. Recommend optimizing low carbohydrate diet efforts and obtaining at least 150 minutes of aerobic exercise per week. Recommend 20-25 grams of dietary fiber daily. Recommend drinking at least 60-80 ounces of water per day.  Vision screening UTD. Dental Screening UTD. Cervical cancer screening is UTD. Gynecologist surveillance is UTD.    Return in about 1 year (around 05/15/2021) for Annual Physical.    Virgilio Frees, MD

## 2020-05-15 ENCOUNTER — Ambulatory Visit (INDEPENDENT_AMBULATORY_CARE_PROVIDER_SITE_OTHER): Payer: BLUE CROSS/BLUE SHIELD | Admitting: Family Medicine

## 2020-05-15 ENCOUNTER — Encounter (INDEPENDENT_AMBULATORY_CARE_PROVIDER_SITE_OTHER): Payer: Self-pay | Admitting: Family Medicine

## 2020-05-15 VITALS — BP 116/60 | HR 69 | Temp 97.7°F | Ht 60.63 in | Wt 208.2 lb

## 2020-05-15 DIAGNOSIS — E6609 Other obesity due to excess calories: Secondary | ICD-10-CM | POA: Insufficient documentation

## 2020-05-15 DIAGNOSIS — Z131 Encounter for screening for diabetes mellitus: Secondary | ICD-10-CM

## 2020-05-15 DIAGNOSIS — Z23 Encounter for immunization: Secondary | ICD-10-CM

## 2020-05-15 DIAGNOSIS — Z0000 Encounter for general adult medical examination without abnormal findings: Secondary | ICD-10-CM

## 2020-05-15 DIAGNOSIS — Z6839 Body mass index (BMI) 39.0-39.9, adult: Secondary | ICD-10-CM

## 2020-05-15 DIAGNOSIS — Z13220 Encounter for screening for lipoid disorders: Secondary | ICD-10-CM

## 2020-05-15 LAB — LIPID PANEL
Cholesterol / HDL Ratio: 2.9
Cholesterol: 194 mg/dL (ref 0–199)
HDL: 68 mg/dL (ref 40–9999)
LDL Calculated: 115 mg/dL — ABNORMAL HIGH (ref 0–99)
Triglycerides: 57 mg/dL (ref 34–149)
VLDL Calculated: 11 mg/dL (ref 10–40)

## 2020-05-15 LAB — COMPREHENSIVE METABOLIC PANEL
ALT: 23 U/L (ref 0–55)
AST (SGOT): 17 U/L (ref 5–34)
Albumin/Globulin Ratio: 1.3 (ref 0.9–2.2)
Albumin: 4.1 g/dL (ref 3.5–5.0)
Alkaline Phosphatase: 39 U/L (ref 37–117)
Anion Gap: 9 (ref 5.0–15.0)
BUN: 16 mg/dL (ref 7.0–19.0)
Bilirubin, Total: 0.4 mg/dL (ref 0.2–1.2)
CO2: 25 mEq/L (ref 21–29)
Calcium: 8.8 mg/dL (ref 8.5–10.5)
Chloride: 106 mEq/L (ref 100–111)
Creatinine: 0.7 mg/dL (ref 0.4–1.5)
Globulin: 3.2 g/dL (ref 2.0–3.7)
Glucose: 98 mg/dL (ref 70–100)
Potassium: 4.6 mEq/L (ref 3.5–5.1)
Protein, Total: 7.3 g/dL (ref 6.0–8.3)
Sodium: 140 mEq/L (ref 136–145)

## 2020-05-15 LAB — HEMOLYSIS INDEX: Hemolysis Index: 7 (ref 0–24)

## 2020-05-15 LAB — GFR: EGFR: >60

## 2020-05-15 NOTE — Patient Instructions (Signed)
Prevention Guidelines, Women Ages 18 to 39  Screening tests and vaccines are an important part of managing your health. A screening test is done to find possible disorders or diseases in people who don't have any symptoms. The goal is to find a disease early so lifestyle changes can be made and you can be watched more closely to reduce the risk of disease, or to detect it early enough to treat it most effectively. Screening tests are not considered diagnostic, but are used to determine if more testing is needed. Health counseling is essential, too. Below are guidelines for these, for women ages 18 to 39. Talk with your healthcare provider to make sure you’re up-to-date on what you need.   We understand gender is a spectrum. We may use gendered terms to talk about anatomy and health risk. Please use this sheet in a way that works best for you and your provider as you talk about your care.   Screening  Who needs it  How often    Alcohol misuse All women in this age group  At routine exams   Blood pressure All women in this age group  Yearly checkup if your blood pressure is normal   Normal blood pressure is less than 120/80 mm Hg   If your blood pressure reading is higher than normal, follow the advice of your healthcare provider    Breast cancer All women in this age group should talk with their healthcare providers about the need for clinical breast exams (CBE)1  Clinical breast exam every 3 years    Cervical cancer Women ages 21 and older  Women between ages 21 and 29 should have a Pap test every 3 years; women between ages 30 and 65 are advised to have a Pap test plus an HPV test every 5 years    Chlamydia Sexually active women ages 24 and younger, and women at increased risk for infection  Every 3 years if you're at risk or have symptoms    Depression All women in this age group  At routine exams   Diabetes mellitus, type 2  Adults with no symptoms who are overweight or obese and have 1 or more other risk  factors for diabetes  At least every 3 years. Also, testing for diabetes during pregnancy after the 24th week.     Gonorrhea Sexually active women at increased risk for infection  At routine exams   Hepatitis C Anyone at increased risk  At routine exams   HIV All women At routine exams  and in all pregnant people    Obesity All women in this age group  At routine exams   Syphilis Women at increased risk for infection should talk with their healthcare provider  At routine exams   Tuberculosis Women at increased risk for infection should talk with their healthcare provider  Ask your healthcare provider    Vision All women in this age group  At least 1 complete exam in your 20s, and 2 in your 30s    Vaccine  Who needs it  How often    Chickenpox (varicella)  All women in this age group who have no record of this infection or vaccine  2 doses; the second dose should be given 4 to 8 weeks after the first dose    Hepatitis A Women at increased risk for infection should talk with their healthcare provider  2 doses given at least 6 months apart    Hepatitis B Women at increased risk   for infection should talk with their healthcare provider. Pregnant people should be screened for hepatitis B virus at their first prenatal visit.  3 doses over 6 months; second dose should be given 1 month after the first dose; the third dose should be given at least 2 months after the second dose and at least 4 months after the first dose    Haemophilus influenzae type B (HIB)  Women at increased risk for infection should talk with their healthcare provider  1 to 3 doses   Human papillomavirus (HPV)  All women in this age group up to age 45  3 doses; the second dose should be given 1 to 2 months after the first dose and the third dose given 6 months after the first dose    Influenza (flu) All women in this age group  Once a year   COVID-19 All women in this age group  1 to 2 doses depending on vaccine; talk with your healthcare provider     Measles, mumps, rubella (MMR)  All women in this age group who have no record of these infections or vaccines  1 or 2 doses   Meningococcal Women at increased risk for infection should talk with their healthcare provider  1 or more doses   Pneumococcal conjugate vaccine (PCV13) and pneumococcal polysaccharide vaccine (PPSV23)  Women at increased risk for infection should talk with their healthcare provider  PCV13: 1 dose ages 19 to 65 (protects against 13 types of pneumococcal bacteria)   PPSV23: 1 to 2 doses through age 64, or 1 dose at 65 or older (protects against 23 types of pneumococcal bacteria)    Tetanus/diphtheria/pertussis (Td/Tdap) booster All women in this age group  1 dose Tdap, then Td or Tdap booster every 10 years    Counseling  Who needs it  How often    BRCA gene mutation testing for breast and ovarian cancer susceptibility  Women with increased risk for having gene mutation  When your risk is known    Breast cancer and chemoprevention  Women at high risk for breast cancer  When your risk is known    Diet and exercise Women who are overweight or obese  When diagnosed, and then at routine exams    Domestic violence All women in this age group  Every visit   Sexually transmitted infection prevention  Women who are sexually active  At routine exams   Skin cancer Prevention of skin cancer in fair-skinned adults  At routine exams   Use of tobacco and the health effects it can cause  All women in this age group  Every visit   According to the ACS, women ages 20 to 39 years should have a clinical breast exam (CBE) as part of their routine health exam every 3 years. Breast self-exams are an option for women starting in their 20s. But the U.S. Preventive Services Task Force (USPSTF) does not recommend CBE.   Those who are 36 years old and not up-to-date on their childhood vaccines should get all appropriate catch-up vaccines recommended by the CDC.   The USPSTF recommends that all people ages 15 to 65  years be screened for HIV and those younger or older people at increased risk. The CDC recommends that everyone between the ages of 13 and 64 get tested for HIV at least once as part of routine health care.   [employer] last reviewed this educational content on 10/16/2019  © 2000-2021 [employer]. All rights reserved. This information is not intended   as a substitute for professional medical care. Always follow your healthcare professional's instructions.

## 2020-05-15 NOTE — Progress Notes (Signed)
Have you seen any specialists/other providers since your last visit with [place]?    Yes    Arm preference verified?   Yes    The patient is due for pap smear and Covid-19 vaccine

## 2020-10-03 ENCOUNTER — Telehealth (INDEPENDENT_AMBULATORY_CARE_PROVIDER_SITE_OTHER): Payer: BLUE CROSS/BLUE SHIELD | Admitting: Family Medicine

## 2020-10-03 DIAGNOSIS — S73004D Unspecified dislocation of right hip, subsequent encounter: Secondary | ICD-10-CM

## 2020-10-03 DIAGNOSIS — S73005D Unspecified dislocation of left hip, subsequent encounter: Secondary | ICD-10-CM

## 2020-10-03 DIAGNOSIS — G8929 Other chronic pain: Secondary | ICD-10-CM

## 2020-10-03 DIAGNOSIS — M25552 Pain in left hip: Secondary | ICD-10-CM

## 2020-10-03 MED ORDER — GABAPENTIN 100 MG PO CAPS
100.0000 mg | ORAL_CAPSULE | Freq: Three times a day (TID) | ORAL | 1 refills | Status: DC
Start: 2020-10-03 — End: 2023-05-10

## 2020-10-03 NOTE — Progress Notes (Signed)
Subjective:       Patient ID: Deborah Warren is a 35 y.o. female.  Verbal consent has been obtained from the patient to conduct a video and telephone: yes    Hip Pain   Injury mechanism: has h/o congenital dislocation. The pain is present in the left hip. The quality of the pain is described as shooting. The pain is at a severity of 8/10. The pain is moderate. The pain has been intermittent since onset. Pertinent negatives include no numbness or tingling. Associated symptoms comments: radiating to leg. Exacerbated by: suddenly has shotting pain, more so after she slept for few hours wakes her up in severe pain. She has tried ice, heat and NSAIDs for the symptoms. The treatment provided no relief.     Pt has h/o hip dislocation-congenital disorder. Diagnosed at age of 5,since then on and off hip pain- muscle pain.  The following portions of the patient's history were reviewed and updated as appropriate: allergies, current medications, past family history, past medical history, past social history, past surgical history and problem list.    Review of Systems   Constitutional: Negative for appetite change, chills and fever.   Musculoskeletal: Positive for arthralgias.   Neurological: Negative for tingling and numbness.           Objective:    Physical Exam  Constitutional:       Appearance: Normal appearance.   HENT:      Head: Normocephalic and atraumatic.   Pulmonary:      Effort: Pulmonary effort is normal.   Neurological:      Mental Status: She is alert.             Assessment:       1. Chronic left hip pain  Sports Medicine Referral: Deborah Hearing, MD   2. Bilateral hip dislocation, subsequent encounter  Sports Medicine Referral: Deborah Hearing, MD           Plan:      Procedures  No orders of the defined types were placed in this encounter.      Hip pain- acute on chronic  ?etiolog-has h/o ship dislocation.  Advise tos ee ortho.  Start gabapentin to help with nerve pain.  Risk & Benefits of the new  medication(s) were explained to the pt (and family) who appeared to understand & agree to the treatment plan.  Time spent in discussion: 20 minutes

## 2020-10-04 ENCOUNTER — Encounter (INDEPENDENT_AMBULATORY_CARE_PROVIDER_SITE_OTHER): Payer: Self-pay | Admitting: Family Medicine

## 2020-10-09 ENCOUNTER — Telehealth: Payer: BLUE CROSS/BLUE SHIELD

## 2020-10-09 ENCOUNTER — Ambulatory Visit (INDEPENDENT_AMBULATORY_CARE_PROVIDER_SITE_OTHER): Payer: BLUE CROSS/BLUE SHIELD

## 2020-10-09 ENCOUNTER — Ambulatory Visit (INDEPENDENT_AMBULATORY_CARE_PROVIDER_SITE_OTHER): Payer: BLUE CROSS/BLUE SHIELD | Admitting: Orthopaedic Surgery

## 2020-10-09 ENCOUNTER — Encounter (INDEPENDENT_AMBULATORY_CARE_PROVIDER_SITE_OTHER): Payer: Self-pay | Admitting: Orthopaedic Surgery

## 2020-10-09 VITALS — BP 110/75 | HR 80 | Ht 61.0 in | Wt 200.0 lb

## 2020-10-09 DIAGNOSIS — M25559 Pain in unspecified hip: Secondary | ICD-10-CM

## 2020-10-09 MED ORDER — METHYLPREDNISOLONE 4 MG PO TBPK
ORAL_TABLET | ORAL | 0 refills | Status: AC
Start: 2020-10-09 — End: 2020-10-15

## 2020-10-09 NOTE — Progress Notes (Signed)
[facility]    Provider: Marjory Sneddon, MD  Date of Exam:  10/09/2020   Patient:  Deborah Warren  DOB:  1985/10/29    AGE:  35 y.o.  MR#:  [id number]     Chief Complaint: Right hip pain.    HPI: Sibbie is a pleasant 35 y.o. female who presents with bilateral congenital hip dislocations.  She has been dealing with this new complaint of intense, severe, radiating pain from the left buttock to the left calf for about 3 weeks.  Insidious onset of new symptoms.  She describes it as surging pain down the posterior left lower extremity.  She explains that she is used to the feeling of bilateral muscular and joint pain however, this feels different.  For other past medical history, social history, family history and past surgical history please see them listed below.        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 without serious comorbidity with body mass index (BMI) of 39.0 to 39.9 in adult        Past Medical History:   Past Medical History:   Diagnosis Date    Congenital hip dysplasia        Social History:   Social History     Tobacco Use    Smoking status: Former Smoker     Quit date: 04/16/2006     Years since quitting: 14.4    Smokeless tobacco: Never Used    Tobacco comment: 0.5 PPD for 8 years   Vaping Use    Vaping Use: Never used   Substance Use Topics    Alcohol use: No    Drug use: No       Family History:   Family History   Problem Relation Age of Onset    No known problems Mother     Brain Aneurysm Father        Past Surgical History: History reviewed. No pertinent surgical history.    Medications:   Current Outpatient Medications:     gabapentin (NEURONTIN) 100 MG capsule, Take 1 capsule (100 mg total) by mouth 3 (three) times daily, Disp: 30 capsule, Rfl: 1    ibuprofen (ADVIL) 400 MG tablet, Take 400 mg by mouth every 6 (six) hours as needed for Pain PRN, Disp: , Rfl:     Allergies: No Known  Allergies    ROS:  Constitutional: No fatigue, fever, weight loss, or weight gain.   Ears, Nose, Mouth & Throat: No sore throat or hearing loss.   Cardiovascular: No chest pain, blood clots, or leg cramps.   Respiratory: No shortness of breath, cough, or difficulty breathing.   Gastrointestinal: No nausea, vomiting, diarrhea, or loss of appetite.   Genitourinary: No polyuria or kidney disease.   Musculoskeletal: No joint aches, muscle weakness or swelling of joints/body parts other than that mentioned above/below.  Integumentary: No finger nail changes or skin dryness.   Neurological: No numbness, burning discomfort, or headaches.   Psychiatric: No depression or anxiety.   Endocrine: No increased thirst, change in appetite or thyroid disease.   Hematologic/Lymphatic: No easy bruising or anemia.     Exam:   Left Hip Exam:    This is a well-appearing patient in no acute distress and ambulates with normal gait and station. There are no obvious deformities or abnormalities of the LE.    No  lumbar or sacral tenderness.   No tenderness to palpation of ASIS,  Iliac Crest    TTP proximal IT Band, greater trochanter , PSIS  Range of motion of hip flexion/extension is 0-140 degrees, internal rotation to 10 degrees w/ pain, external rotation of 50 degrees.   Patient is able to SLR but passive SLR elicits severe pain in the glute to posterior thigh at 30 degrees.   Straight leg raise is normal   Bilaterally 5/5 EHL, FHL, anterior tibialis, gastroc/soleus, quads and hamstrings    Sensation is normal through L1-S1 distribution   The patient is otherwise neurovascularly intact    Right Hip Exam:       this is a well-appearing patient in no acute distress and ambulates with normal gait and station. There are no obvious deformities or abnormalities of the LE.    No lumbar or sacral tenderness.   No tenderness to palpation of ASIS, PSIS, Iliac Crest    TTP proximal IT Band, greater trochanter   Range of motion of hip  flexion/extension is 0-140 degrees, internal rotation to 10 degrees w/ pain, external rotation of 50 degrees.   Straight leg raise is normal   Bilaterally 5/5 EHL, FHL, anterior tibialis, gastroc/soleus, quads and hamstrings    Sensation is normal through L1-S1 distribution   The patient is otherwise neurovascularly intact       Vitals: BP 110/75    Pulse 80    Ht 1.549 m (5\' 1" )    Wt 90.7 kg (200 lb)    BMI 37.79 kg/m     General: Maryhelen was awake, alert, oriented, easily engaged, displayed logical thinking with clear speech and was neat in appearance. Her general appearance was normal, well-developed and well-nourished.    Gait: The patient demonstrated mildly antalgic gait with intact coordination and balance.     Studies: AP pelvis, modified dunn lateral, frog leg lateral and false profile views of the left hip were ordered and reviewed on today's visit.  X-rays demonstrate bilateral congenital hip dislocations.  No acute bony process, fracture, dislocation.    Assessment/Plan: Jermeka is a pleasant 35 y.o. 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  The patient's diagnosis and treatment options were discussed in detail at today's visit.  We talked about treatment options including acutely she will try a Medrol Dosepak to reduce the symptoms of the sciatic nerve. We reviewed the standard conservative and surgical management of her diagnosis as well as the use of medications such as Tylenol and NSAIDS.  We discussed the limited role of injection therapy in management and its potential role in diagnosis and short term pain relief.  The potential need for surgical intervention was described in detail including the procedure, risks, rehab and recovery.  All questions were answered to the patient's satisfaction.  The patient will notify my clinic of any changes or worsening of their symptoms during the interim.   We will plan on follow up as needed.     Elements of MDM:   1. Number and Complexity of Problems Addressed: E&MData4: Chronic with Exacerbation    2. Data Reviewed: (need A, B, or C)  A) Independent Radiology interpretation: Yes XR hip     B) (need 3):   New Radiology/Labs ordered: Yes xr hip   Outside Radiology reports/Labs reviewed: No  Involve Independent Historian: No  External provider notes reviewed: No      C)  Discussion with External provider: No    3. Risk of Problem/Management: Moderate risk from additional diagnostic testing or treatment   (need 1)  Closed Fracture Care: No    Medication Prescriptions Given: Yes  Minor Surgery/Injections with Risk Factors Discussed: Yes  Major Surgery without Risk Factors Discussed: Yes    --------------------------------------------------    Level of Medical Decision Making: Moderate - Level 4 Visit  (Need 2 of 3 categories above)  I, Marjory Sneddon, MD, have personally performed the history, physical exam and medical decision making; and confirmed the accuracy of the information in the transcribed note.    Date: 10/09/2020 Time: 1:05 PM    The review of the patient's medications does not in any way constitute an endorsement, by this clinician,  of their use, dosage, indications, route, efficacy, interactions, or other clinical parameters.    This note may have been generated in part or in whole within the [employer] EMR using Dragon medical speech recognition software and may contain inherent errors or omissions not intended by the user. Grammatical and punctuation errors, random word insertions, deletions, pronoun errors and incomplete sentences are occasional consequences of this technology due to software limitations. Not all errors are caught or corrected.  Although every attempt is made to root out erroneus and incomplete transcription, the note may still not fully represent the intent or opinion of the author. If there are questions or concerns about the content of  this note or information contained within the body of this dictation they should be addressed directly with the author for clarification.

## 2020-10-10 ENCOUNTER — Telehealth (INDEPENDENT_AMBULATORY_CARE_PROVIDER_SITE_OTHER): Payer: Self-pay | Admitting: Family Medicine

## 2021-02-21 ENCOUNTER — Other Ambulatory Visit: Payer: Self-pay | Admitting: Orthopaedic Surgery

## 2021-02-21 DIAGNOSIS — M4726 Other spondylosis with radiculopathy, lumbar region: Secondary | ICD-10-CM

## 2021-02-22 ENCOUNTER — Other Ambulatory Visit: Payer: Self-pay | Admitting: Orthopaedic Surgery

## 2021-02-22 ENCOUNTER — Ambulatory Visit
Admission: RE | Admit: 2021-02-22 | Discharge: 2021-02-22 | Disposition: A | Discharge status: Home / Self Care | Payer: BLUE CROSS/BLUE SHIELD | Source: Ambulatory Visit | Attending: Orthopaedic Surgery | Admitting: Orthopaedic Surgery

## 2021-02-22 DIAGNOSIS — M4726 Other spondylosis with radiculopathy, lumbar region: Secondary | ICD-10-CM

## 2021-09-01 ENCOUNTER — Ambulatory Visit (INDEPENDENT_AMBULATORY_CARE_PROVIDER_SITE_OTHER): Payer: BLUE CROSS/BLUE SHIELD | Admitting: Family Medicine

## 2021-09-01 ENCOUNTER — Encounter (INDEPENDENT_AMBULATORY_CARE_PROVIDER_SITE_OTHER): Payer: Self-pay | Admitting: Family Medicine

## 2021-09-01 VITALS — BP 140/86 | HR 73 | Temp 98.0°F | Ht 61.0 in | Wt 175.2 lb

## 2021-09-01 DIAGNOSIS — G8929 Other chronic pain: Secondary | ICD-10-CM

## 2021-09-01 DIAGNOSIS — M545 Low back pain, unspecified: Secondary | ICD-10-CM

## 2021-09-01 DIAGNOSIS — Z01818 Encounter for other preprocedural examination: Secondary | ICD-10-CM

## 2021-09-01 LAB — COMPREHENSIVE METABOLIC PANEL
ALT: 44 U/L (ref 0–55)
AST (SGOT): 32 U/L (ref 5–41)
Albumin/Globulin Ratio: 1.3 (ref 0.9–2.2)
Albumin: 4 g/dL (ref 3.5–5.0)
Alkaline Phosphatase: 33 U/L — ABNORMAL LOW (ref 37–117)
Anion Gap: 11 (ref 5.0–15.0)
BUN: 8 mg/dL (ref 7.0–21.0)
Bilirubin, Total: 0.3 mg/dL (ref 0.2–1.2)
CO2: 25 mEq/L (ref 17–29)
Calcium: 9.5 mg/dL (ref 8.5–10.5)
Chloride: 104 mEq/L (ref 99–111)
Creatinine: 0.6 mg/dL (ref 0.4–1.0)
Globulin: 3.1 g/dL (ref 2.0–3.6)
Glucose: 74 mg/dL (ref 70–100)
Potassium: 4.3 mEq/L (ref 3.5–5.3)
Protein, Total: 7.1 g/dL (ref 6.0–8.3)
Sodium: 140 mEq/L (ref 135–145)

## 2021-09-01 LAB — PT/INR
PT INR: 0.9 (ref 0.9–1.1)
PT: 10.9 s (ref 10.1–12.9)

## 2021-09-01 LAB — URINALYSIS, REFLEX TO MICROSCOPIC EXAM IF INDICATED
Bilirubin, UA: NEGATIVE
Blood, UA: NEGATIVE
Glucose, UA: NEGATIVE
Leukocyte Esterase, UA: NEGATIVE
Nitrite, UA: NEGATIVE
Protein, UR: NEGATIVE
Specific Gravity UA: 1.013 (ref 1.001–1.035)
Urine pH: 6.5 (ref 5.0–8.0)
Urobilinogen, UA: NORMAL mg/dL

## 2021-09-01 LAB — CBC AND DIFFERENTIAL
Absolute NRBC: 0 10*3/uL (ref 0.00–0.00)
Basophils Absolute Automated: 0.05 10*3/uL (ref 0.00–0.08)
Basophils Automated: 0.7 %
Eosinophils Absolute Automated: 0.12 10*3/uL (ref 0.00–0.44)
Eosinophils Automated: 1.6 %
Hematocrit: 38.6 % (ref 34.7–43.7)
Hgb: 12.3 g/dL (ref 11.4–14.8)
Immature Granulocytes Absolute: 0.02 10*3/uL (ref 0.00–0.07)
Immature Granulocytes: 0.3 %
Instrument Absolute Neutrophil Count: 5.45 10*3/uL (ref 1.10–6.33)
Lymphocytes Absolute Automated: 1.45 10*3/uL (ref 0.42–3.22)
Lymphocytes Automated: 19 %
MCH: 28 pg (ref 25.1–33.5)
MCHC: 31.9 g/dL (ref 31.5–35.8)
MCV: 87.9 fL (ref 78.0–96.0)
MPV: 11.7 fL (ref 8.9–12.5)
Monocytes Absolute Automated: 0.53 10*3/uL (ref 0.21–0.85)
Monocytes: 7 %
Neutrophils Absolute: 5.45 10*3/uL (ref 1.10–6.33)
Neutrophils: 71.4 %
Nucleated RBC: 0 /100 WBC (ref 0.0–0.0)
Platelets: 279 10*3/uL (ref 142–346)
RBC: 4.39 10*6/uL (ref 3.90–5.10)
RDW: 13 % (ref 11–15)
WBC: 7.62 10*3/uL (ref 3.10–9.50)

## 2021-09-01 LAB — HEMOLYSIS INDEX: Hemolysis Index: 2 Index (ref 0–24)

## 2021-09-01 LAB — APTT: PTT: 30 s (ref 27–39)

## 2021-09-01 LAB — HCG QUANTITATIVE: hCG, Quant.: <2.4 m[IU]/mL

## 2021-09-01 LAB — GFR: EGFR: >60

## 2021-09-01 NOTE — Progress Notes (Signed)
Subjective:      Deborah Warren is a 36 y.o. female who presents to the office today for a preoperative consultation at the request of surgeon Dr Nicholas Lose who plans on performing Left L4-L5 Microdecompression on May 4. This consultation is requested for the specific conditions prompting preoperative evaluation (i.e. because of potential affect on operative risk). Planned anesthesia: general. The patient has the following known anesthesia issues:  none . Patients bleeding risk: no recent abnormal bleeding. Patient does not have objections to receiving blood products if needed.    No fam hx of anesthesia issues.    No cp or sob with vigorous walking or going up and down prior to onset of the severe back pain (October).     The following portions of the patient's history were reviewed and updated as appropriate: allergies, current medications, past family history, past medical history, past social history, past surgical history, and problem list.    Review of Systems  Pertinent items are noted in HPI.      Objective:      BP 140/86 (BP Site: Left arm, Patient Position: Sitting, Cuff Size: Medium)   Pulse 73   Temp 98 F (36.7 C)   Ht 1.549 m (5\' 1" )   Wt 79.5 kg (175 lb 3.2 oz)   LMP 08/06/2021 (Exact Date)   SpO2 99%   Breastfeeding No   BMI 33.10 kg/m   General appearance: alert, appears stated age, and cooperative  Head: Normocephalic, without obvious abnormality, atraumatic  Eyes:  conjunctiva clear  Ears: normal TM's and external ear canals both ears  Throat: lips, mucosa, and tongue normal; teeth and gums normal  Neck: no adenopathy, supple, symmetrical, trachea midline, and thyroid not enlarged, symmetric, no tenderness/mass/nodules  Lungs: clear to auscultation bilaterally  Heart: regular rate and rhythm, S1, S2 normal, no murmur, click, rub or gallop  Abdomen: soft, non-tender; bowel sounds normal; no masses,  no organomegaly  Extremities: extremities normal, atraumatic, no cyanosis or  edema  Skin: Skin color, texture, turgor normal. No rashes or lesions  Lymph nodes:  no cervical lymphadenopathy  Neurologic: Alert and oriented X 3, normal strength and tone. Normal symmetric reflexes. Normal coordination and gait    Predictors of intubation difficulty:   Morbid obesity? no   Anatomically abnormal facies? no   Prominent incisors? no   Receding mandible? no   Short, thick neck? no   Neck range of motion: normal   Mallampati score: I (soft palate, uvula, fauces, and tonsillar pillars visible)     Cardiographics  ECG: normal sinus rhythm, no blocks or conduction defects, no ischemic changes    Lab Review   No visits with results within 6 Month(s) from this visit.   Latest known visit with results is:   Office Visit on 05/15/2020   Component Date Value    Glucose 05/15/2020 98     BUN 05/15/2020 16.0     Creatinine 05/15/2020 0.7     Sodium 05/15/2020 140     Potassium 05/15/2020 4.6     Chloride 05/15/2020 106     CO2 05/15/2020 25     Calcium 05/15/2020 8.8     Protein, Total 05/15/2020 7.3     Albumin 05/15/2020 4.1     AST (SGOT) 05/15/2020 17     ALT 05/15/2020 23     Alkaline Phosphatase 05/15/2020 39     Bilirubin, Total 05/15/2020 0.4     Globulin 05/15/2020 3.2     Albumin/Globulin  Ratio 05/15/2020 1.3     Anion Gap 05/15/2020 9.0     Cholesterol 05/15/2020 194     Triglycerides 05/15/2020 57     HDL 05/15/2020 68     LDL Calculated 05/15/2020 161115 (H)     VLDL Calculated 05/15/2020 11     Cholesterol / HDL Ratio 05/15/2020 2.9     EGFR 05/15/2020 >60.0     Hemolysis Index 05/15/2020 7          Assessment:        36 y.o. female with planned surgery as above.    Known risk factors for perioperative complications: None  obesity     Difficulty with intubation is not anticipated.    Cardiac Risk Estimation: [id number]    Current medications which may produce withdrawal symptoms if withheld perioperatively: none      Plan:      1. Preop examination  Vss. No chronic medical conditions.  Good exercise  tolerance when not in pain.  Medically cleared for surgery pending normal labs.    - CBC and differential  - Comprehensive metabolic panel  - Prothrombin time/INR  - APTT  - Beta HCG Quantitative  - ECG 12 lead    2. Chronic low back pain, unspecified back pain laterality, unspecified whether sciatica present  Proceed with surgery.   - CBC and differential  - Comprehensive metabolic panel  - Prothrombin time/INR  - APTT  - Beta HCG Quantitative  - ECG 12 lead

## 2021-09-01 NOTE — Progress Notes (Signed)
Have you seen any specialists since your last visit with us?  Yes      The patient was informed that the following HM items are still outstanding:   Hep C screening

## 2021-09-10 ENCOUNTER — Encounter (INDEPENDENT_AMBULATORY_CARE_PROVIDER_SITE_OTHER): Payer: Self-pay | Admitting: Family Medicine

## 2021-09-14 HISTORY — PX: OTHER SURGICAL HISTORY: SHX169

## 2023-03-15 IMAGING — MR COLANGIORM SC
10 of 14 series · 25 of 48 positions shown · non-contrast
Comparison: none

[Series 1001: loc (id) · axial · 8.0mm · 0.88mm/px · 1 of 3 slices shown (1 of 2)]
[im 1/3]
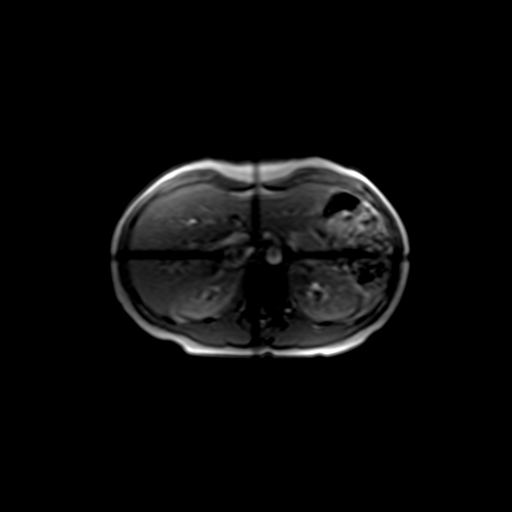

[Series 2001: loc (id) · axial · 8.0mm · 0.88mm/px · 1 of 3 slices shown (2 of 2)]
[im 1/3]
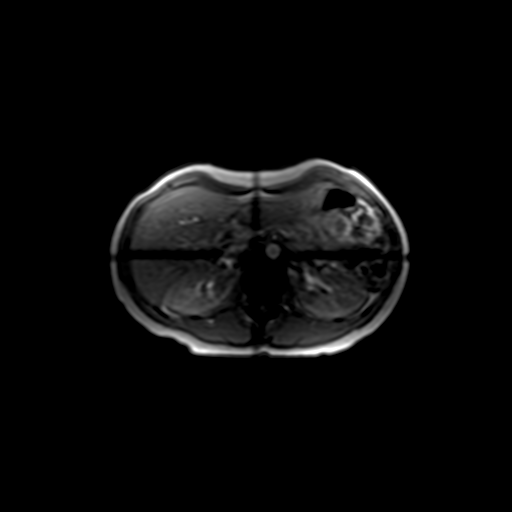

[Series 5001: T2 · coronal · 5.0mm · 0.69mm/px · 2 of 30 slices shown (1 of 3)]
[im 1/30]
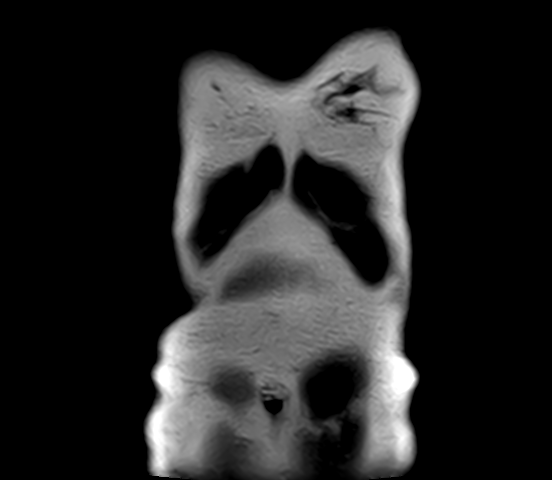
[im 30/30]
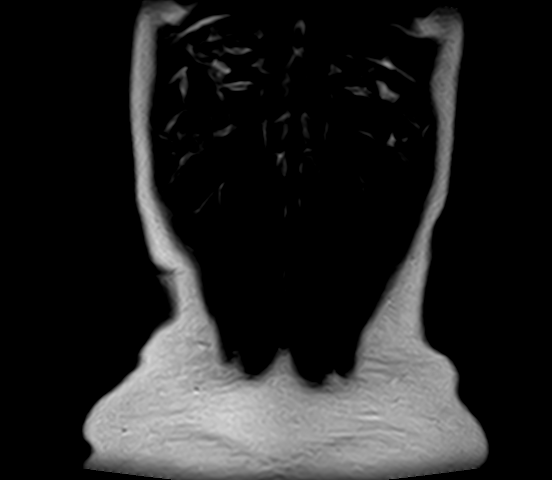

[Series 6001: T2 · axial · 5.0mm · 0.83mm/px · z∈[-136,+74]mm · 3 of 36 slices shown (2 of 3)]
[im 1/36]
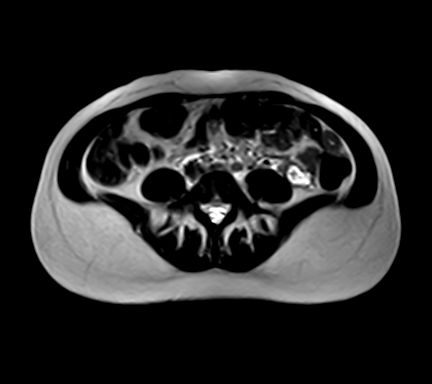
[im 18/36]
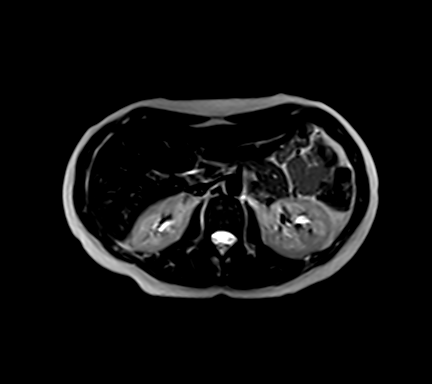
[im 36/36]
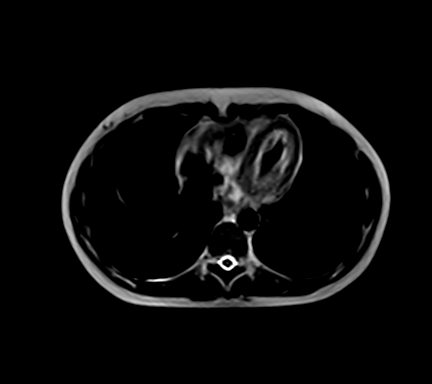

[Series 7001: T2 · axial · 5.0mm · 0.80mm/px · z∈[-136,+74]mm · 3 of 36 slices shown (3 of 3)]
[im 1/36]
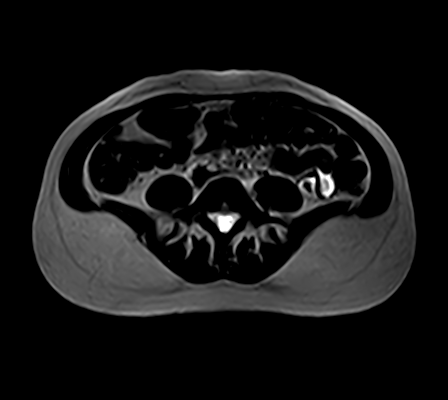
[im 18/36]
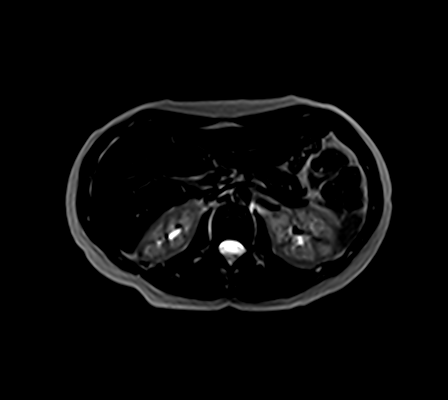
[im 36/36]
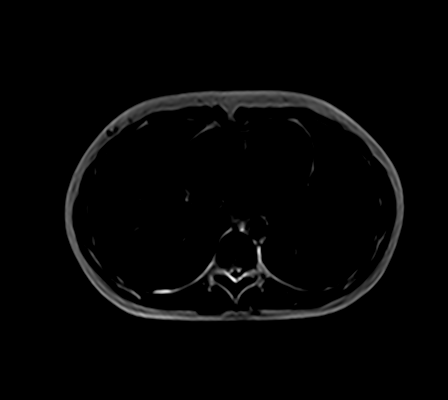

[Series 8001: ax t2wi fatsat · axial · 5.0mm · 0.80mm/px · z∈[-136,+74]mm · 4 of 36 slices shown]
[im 1/36]
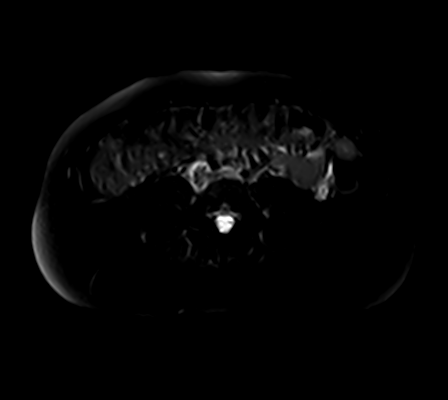
[im 12/36]
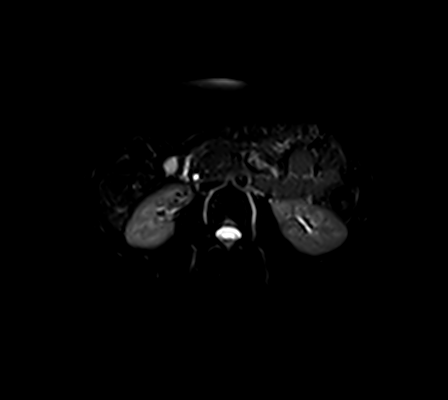
[im 24/36]
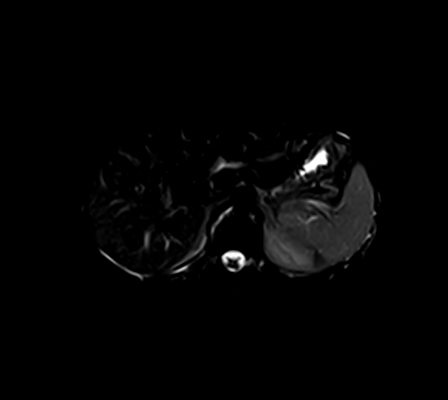
[im 36/36]
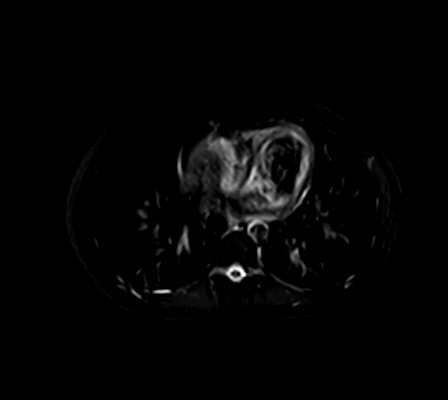

[Series 9003: mpr batch · coronal · 61.7mm · 0.55mm/px · 2 of 18 slices shown]
[im 1/18]
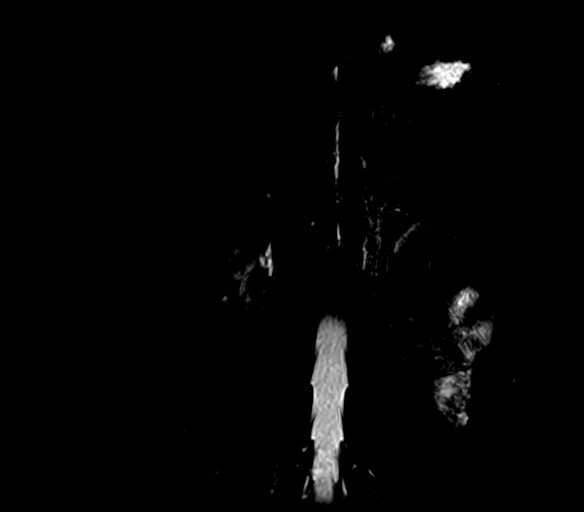
[im 18/18]
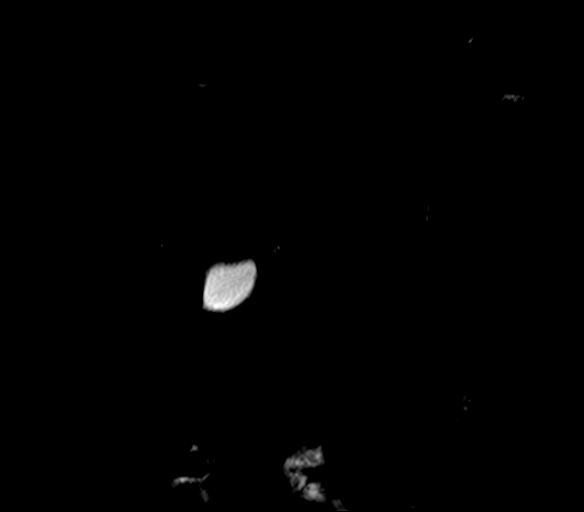

[colangio radial · coronal · 30.0mm · 1.37mm/px · 1 of 8 slices shown]
[im 1/8]
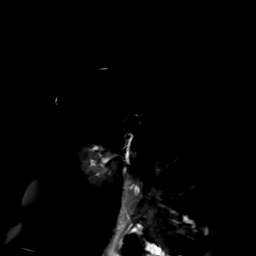

[ax t1wi(in/out) (bh) · axial · 5.0mm · 0.70mm/px · z∈[-136,+74]mm · 7 of 72 slices shown]
[im 1/72]
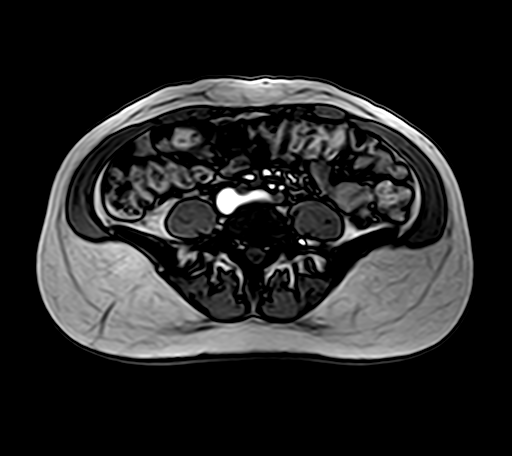
[im 12/72]
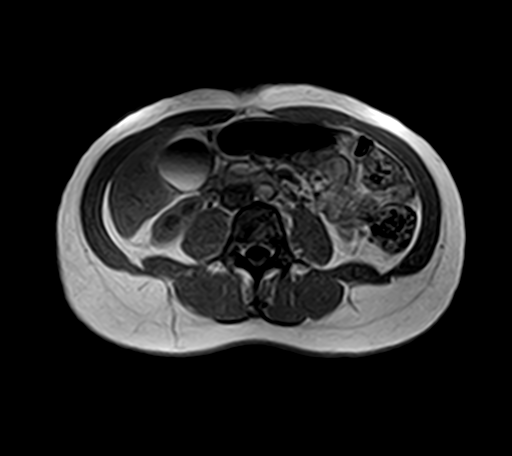
[im 24/72]
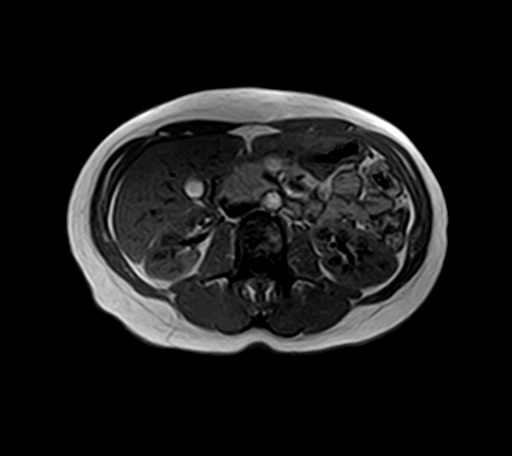
[im 36/72]
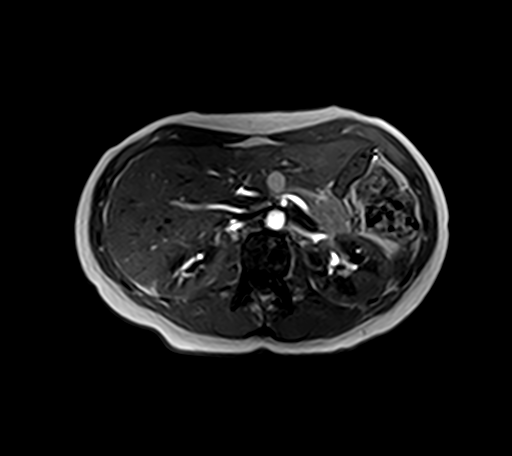
[im 48/72]
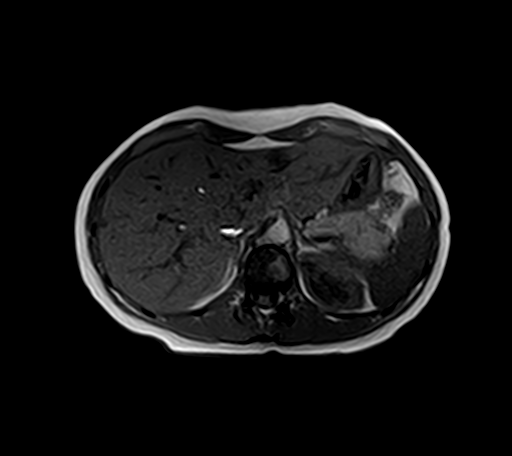
[im 60/72]
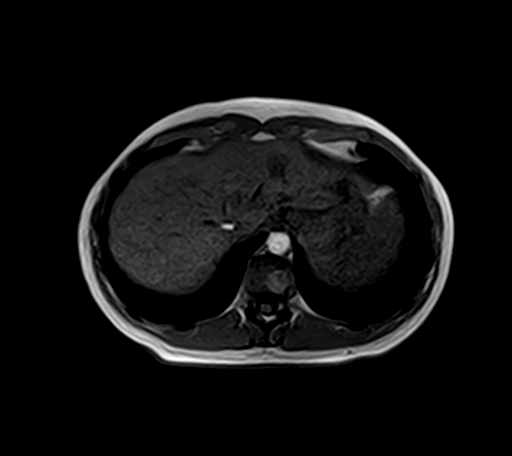
[im 72/72]
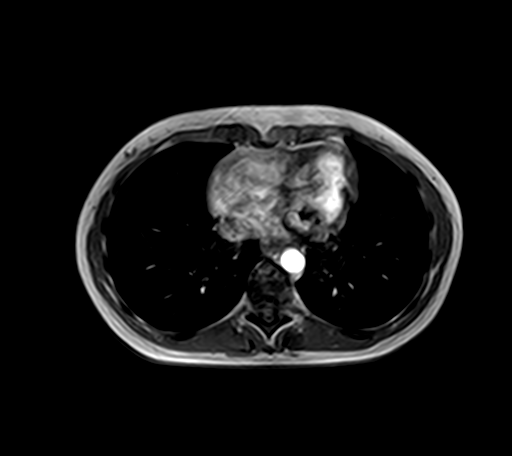

[ax difusao · axial · 5.5mm · 1.25mm/px · 1 of 38 slices shown]
[im 1/38]
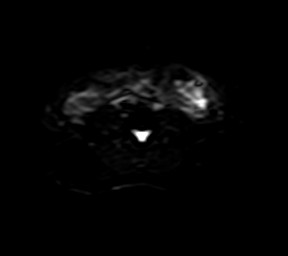

[25 of 48 positions shown; findings below may reference images not displayed]

COLANGIORRESSONÂNCIA MAGNÉTICA SEM CONTRASTE

TÉCNICA: 
Exame realizado em equipamento de ressonância magnética com sequências, ponderações e planos específicos para o segmento de interesse, sem a administração endovenosa do meio de contraste.

RESULTADO:
Não há dilatação da árvore biliar intra ou extra-hepática.
Vesícula biliar com forma e dimensões normais, sem evidências de cálculos no interior.
Fígado com forma, contornos, dimensões e intensidade de sinal normal, notando-se nódulo com alto sinal na ponderação em T2 no segmento VII / VIII medindo 0,8 x 0,5 cm.
Baço sem alterações.
Pâncreas com forma normal e intensidade de sinal preservada.
Rins tópicos, com forma, dimensões e intensidade de sinal normal. Não há dilatação dos sistemas coletores. 
Ausência de massas ou coleções nas regiões estudadas.

CONCLUSÃO:
Pequeno nódulo hepático indeterminado neste exame sem contraste.

## 2023-05-10 ENCOUNTER — Ambulatory Visit (INDEPENDENT_AMBULATORY_CARE_PROVIDER_SITE_OTHER): Payer: BLUE CROSS/BLUE SHIELD | Admitting: Family Medicine

## 2023-05-10 ENCOUNTER — Encounter (INDEPENDENT_AMBULATORY_CARE_PROVIDER_SITE_OTHER): Payer: Self-pay | Admitting: Family Medicine

## 2023-05-10 VITALS — BP 96/60 | HR 58 | Temp 97.7°F | Ht 61.12 in | Wt 150.4 lb

## 2023-05-10 DIAGNOSIS — Q6589 Other specified congenital deformities of hip: Secondary | ICD-10-CM

## 2023-05-10 DIAGNOSIS — Z13220 Encounter for screening for lipoid disorders: Secondary | ICD-10-CM

## 2023-05-10 DIAGNOSIS — Z1159 Encounter for screening for other viral diseases: Secondary | ICD-10-CM

## 2023-05-10 DIAGNOSIS — Z131 Encounter for screening for diabetes mellitus: Secondary | ICD-10-CM

## 2023-05-10 DIAGNOSIS — Z0000 Encounter for general adult medical examination without abnormal findings: Secondary | ICD-10-CM

## 2023-05-10 DIAGNOSIS — Z1331 Encounter for screening for depression: Secondary | ICD-10-CM

## 2023-05-10 LAB — COMPREHENSIVE METABOLIC PANEL
ALT: 12 U/L (ref <=55)
AST (SGOT): 20 U/L (ref <=41)
Albumin/Globulin Ratio: 1.3 (ref 0.9–2.2)
Albumin: 4.1 g/dL (ref 3.5–5.0)
Alkaline Phosphatase: 33 U/L — ABNORMAL LOW (ref 37–117)
Anion Gap: 6 (ref 5.0–15.0)
BUN: 13 mg/dL (ref 7–21)
Bilirubin, Total: 0.6 mg/dL (ref 0.2–1.2)
CO2: 26 meq/L (ref 17–29)
Calcium: 8.9 mg/dL (ref 8.5–10.5)
Chloride: 108 meq/L (ref 99–111)
Creatinine: 0.6 mg/dL (ref 0.4–1.0)
GFR: >60 mL/min/{1.73_m2} (ref >=60.0)
Globulin: 3.1 g/dL (ref 2.0–3.6)
Glucose: 95 mg/dL (ref 70–100)
Hemolysis Index: 4 {index}
Potassium: 4.2 meq/L (ref 3.5–5.3)
Protein, Total: 7.2 g/dL (ref 6.0–8.3)
Sodium: 140 meq/L (ref 135–145)

## 2023-05-10 LAB — HEPATITIS C ANTIBODY, TOTAL: Hepatitis C Antibody: NONREACTIVE

## 2023-05-10 LAB — HEMOGLOBIN A1C
Average Estimated Glucose: 99.7 mg/dL
Hemoglobin A1C: 5.1 % (ref 4.6–5.6)

## 2023-05-10 LAB — LIPID PANEL
Cholesterol / HDL Ratio: 2.5 {index}
Cholesterol: 199 mg/dL (ref <=199)
HDL: 80 mg/dL (ref >=40)
LDL Calculated: 108 mg/dL — ABNORMAL HIGH (ref 0–99)
Triglycerides: 57 mg/dL (ref 34–149)
VLDL Calculated: 11 mg/dL (ref 10–40)

## 2023-05-10 LAB — LAB USE ONLY - GOLD SST HOLD TUBE

## 2023-05-10 NOTE — Progress Notes (Signed)
 Have you seen any specialists/other providers since your last visit with [place]?    Yes Gyn, Dentist, Ophthalmologist, Orthopedic      The patient was informed that the following HM items are still outstanding:   Health Maintenance Due   Topic Date Due    HEPA

## 2023-05-10 NOTE — Progress Notes (Signed)
 Subjective:      Patient ID: Deborah Warren is a 37 y.o. female.    Chief Complaint:  Chief Complaint   Patient presents with    Annual Exam     Fasting       HPI  Visit Type: Health Maintenance Visit  Work Status: working full-time Armed forces technical officer at a

## 2023-05-13 IMAGING — CT Imaging study
3 of 13 series · 11 of 42 positions shown, 17 images · non-contrast
Comparison: none

[Series 3: s/c body 2.0 · axial · 0.74mm/px · z∈[+1372,+1642]mm · 4 of 451 slices shown]
[im 91/451  soft-tissue]
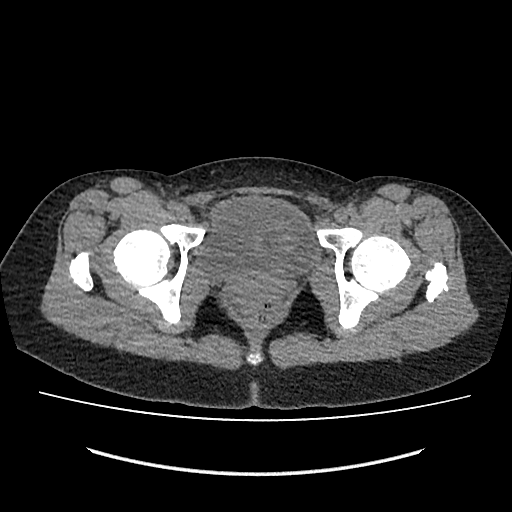
[im 181/451  soft-tissue]
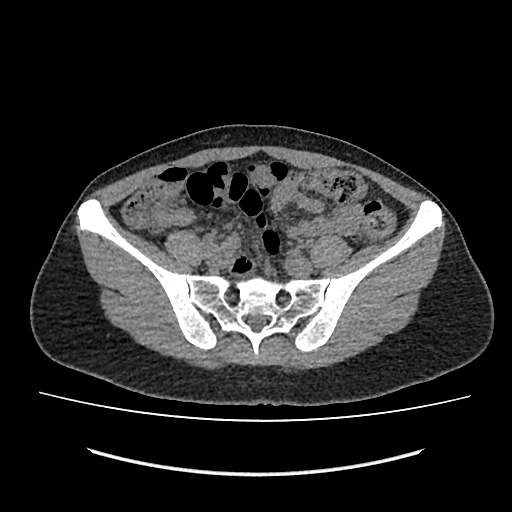
[im 271/451  soft-tissue]
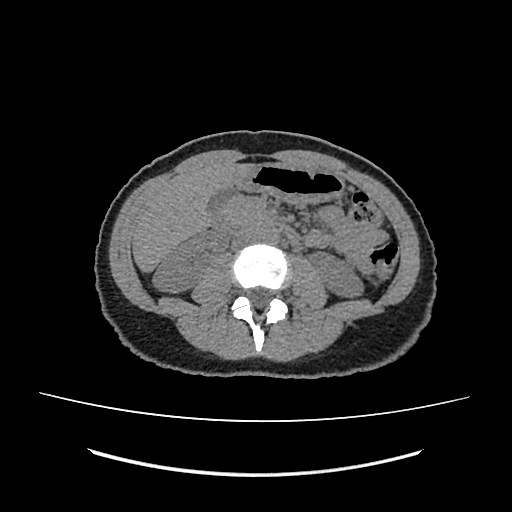
[im 361/451  soft-tissue]
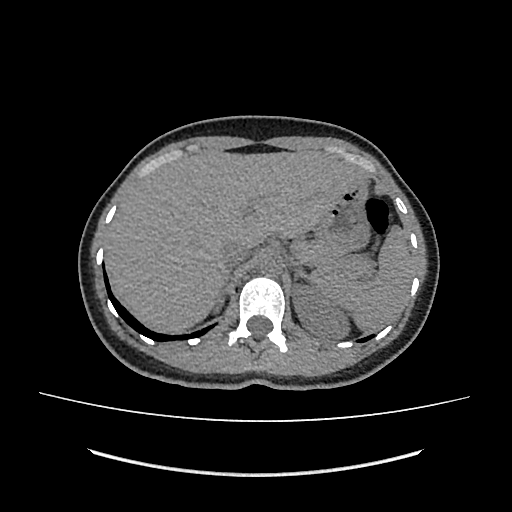

[Series 7: portal body 2.0 ce · axial · portal-venous · 0.74mm/px · z∈[+1372,+1642]mm · 4 of 451 slices shown, 9 images]
[im 91/451  soft-tissue]
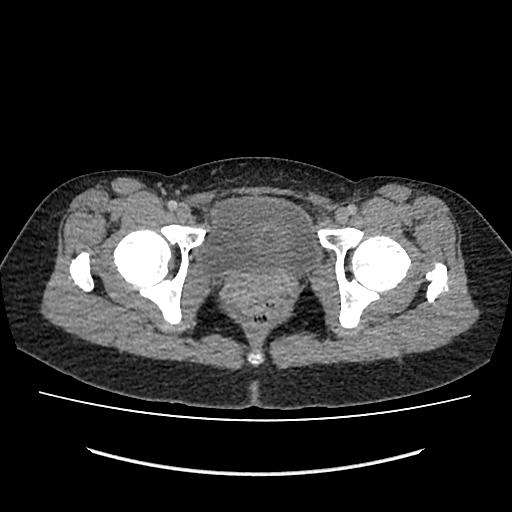
[im 91/451  lung]
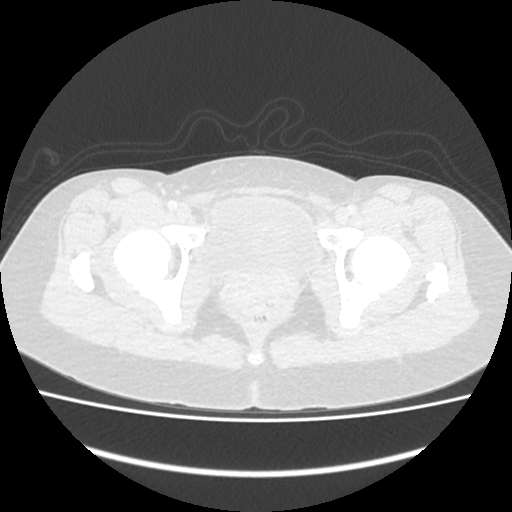
[im 91/451  bone]
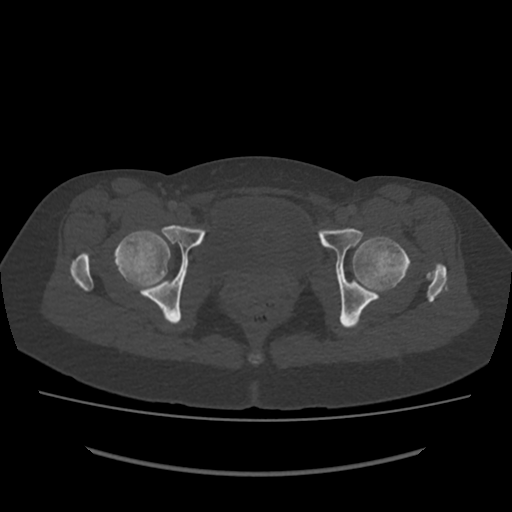
[im 181/451  soft-tissue]
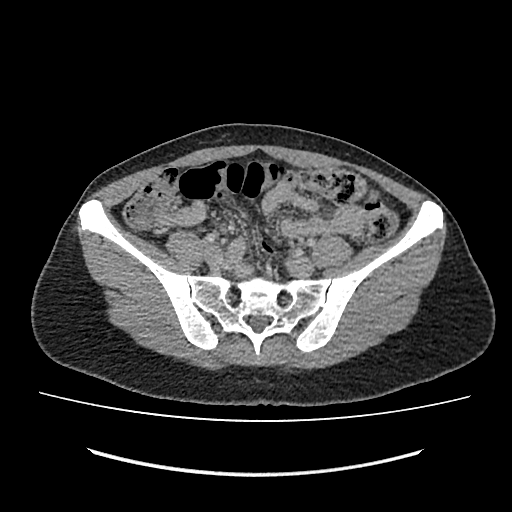
[im 181/451  lung]
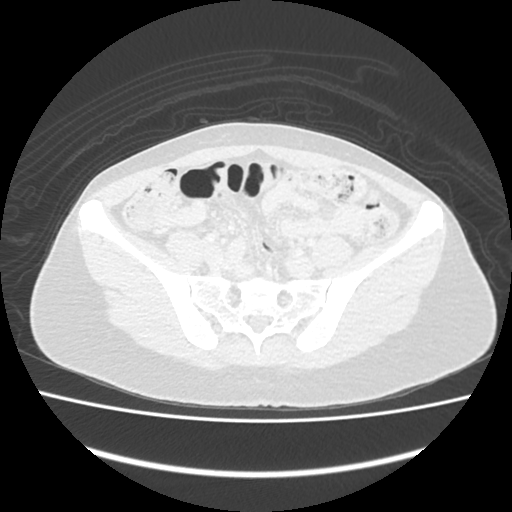
[im 271/451  soft-tissue]
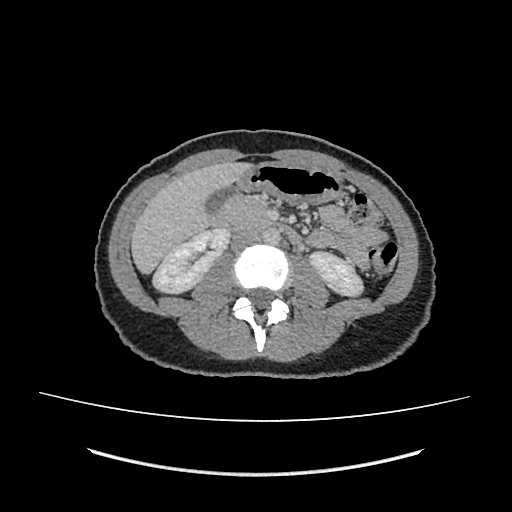
[im 271/451  lung]
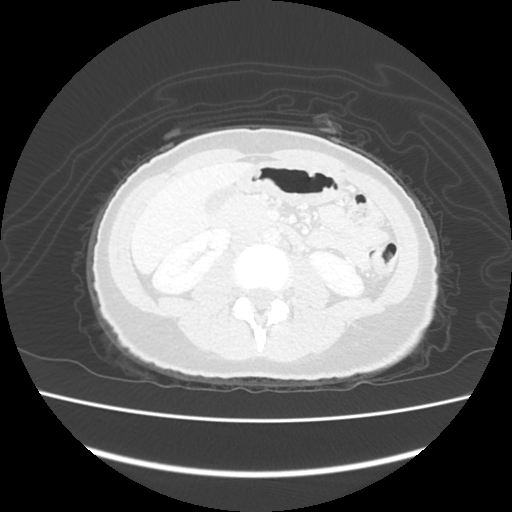
[im 361/451  soft-tissue]
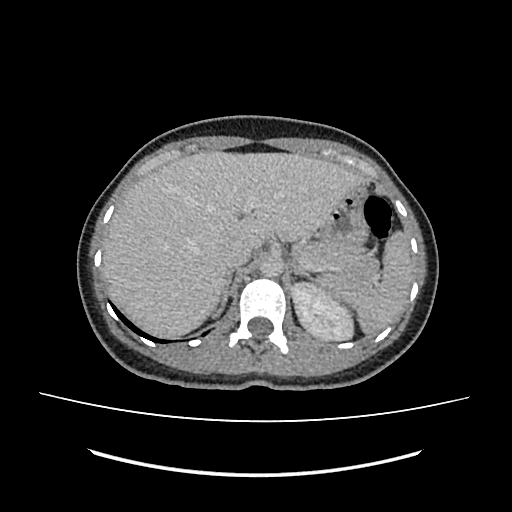
[im 361/451  lung]
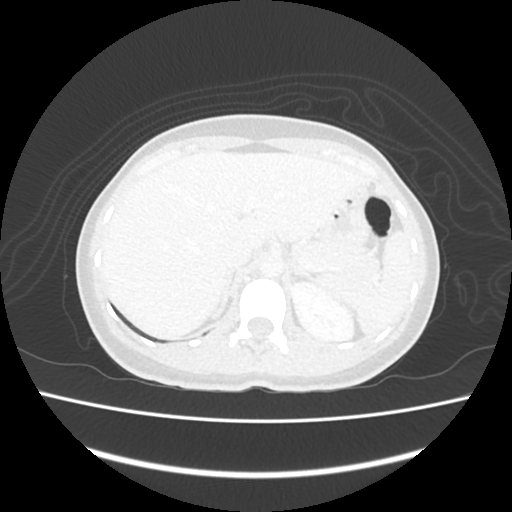

[Series 16: tardia body 3.000 ce · coronal · 0.72mm/px · 3 of 20 slices shown, 4 images]
[im 7/20  soft-tissue]
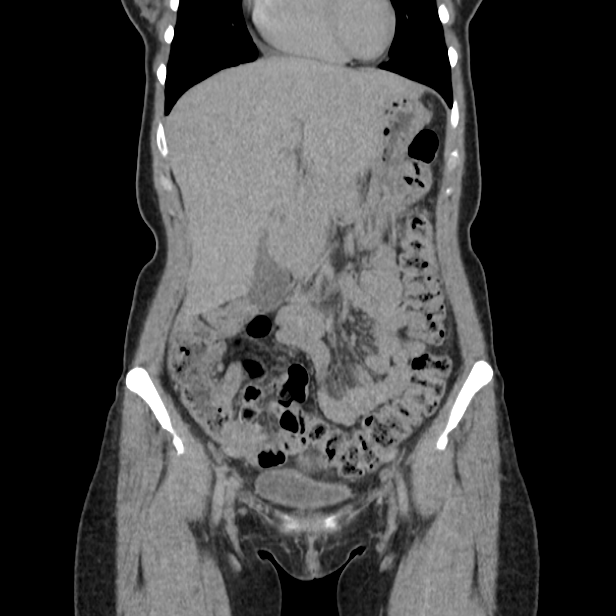
[im 9/20  soft-tissue]
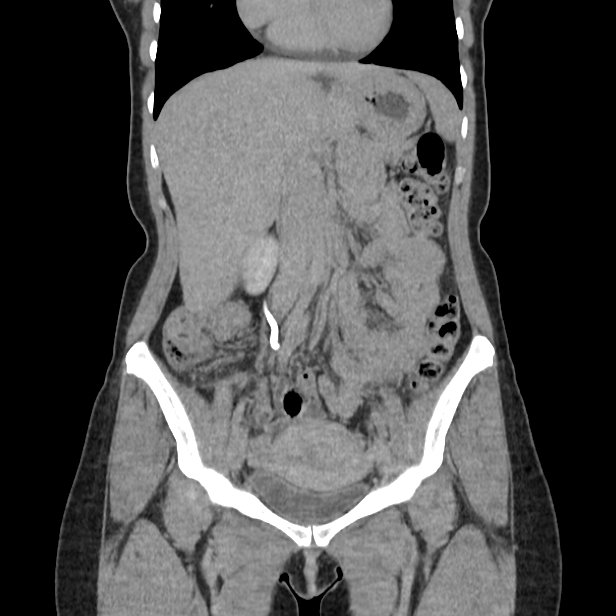
[im 9/20  bone]
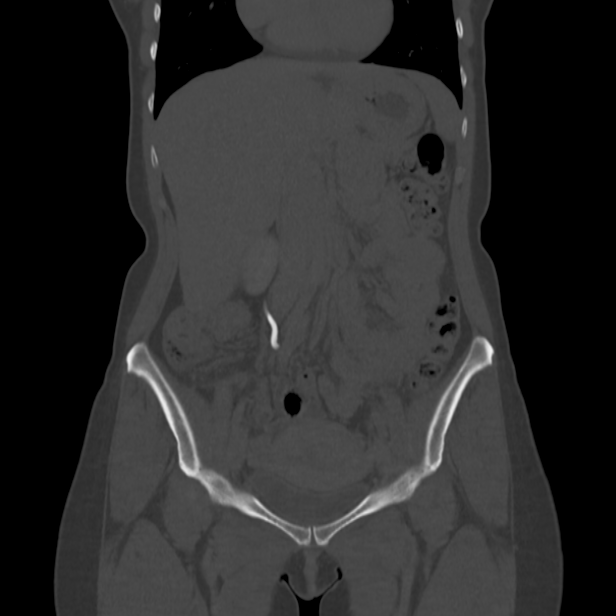
[im 11/20  soft-tissue]
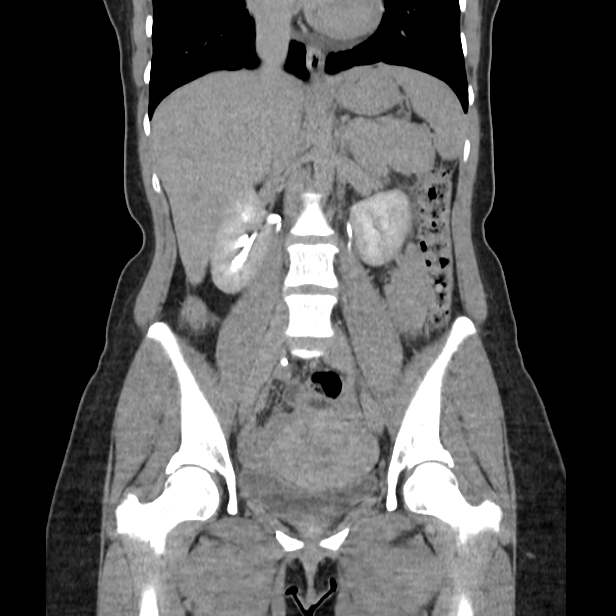

[11 of 42 positions shown; findings below may reference images not displayed]

TOMOGRAFIA COMPUTADORIZADA DO ABDOME E PELVE

TÉCNICA:
Exame realizado em aparelho de tomografia computadorizada, com colimação, filtros e reconstruções específicas para o segmento de interesse, antes e após a administração endovenosa do meio de contraste. Previamente ao exame não foi realizada opacificação do trato gastrointestinal.

RESULTADO:
Fígado de dimensões e contornos habituais, apresentando imagem nodular hipodensa, medindo 0,9 cm, no segmento VII / VIII, de difícil caracterização.
Não há sinais de dilatação das vias biliares intra ou extra-hepáticas.
Pâncreas com dimensões, contorno e densidade normais.
Baço de densidade homogênea e dimensões anatômicas.
Adrenais de formas, densidades e dimensões normais.
Rins de contornos e dimensões normais, concentrando o meio de contraste simetricamente. Não há sinais de dilatação pielocalicinal ou de litíase.
Ausência de imagens compatíveis com linfonodomegalias.
Aorta de calibre e contornos normais.
Bexiga com boa capacidade e contornos regulares.
Não há evidências de líquido livre na cavidade peritoneal.

CONCLUSÃO:
Imagem nodular hipodensa no segmento VII / VIII hepático.

## 2024-05-22 ENCOUNTER — Encounter (INDEPENDENT_AMBULATORY_CARE_PROVIDER_SITE_OTHER): Payer: Self-pay | Admitting: Family Medicine

## 2024-05-28 ENCOUNTER — Telehealth (INDEPENDENT_AMBULATORY_CARE_PROVIDER_SITE_OTHER): Payer: Self-pay | Admitting: Family Medicine

## 2024-05-28 NOTE — Telephone Encounter (Signed)
 Scheduled.

## 2024-07-18 ENCOUNTER — Encounter (INDEPENDENT_AMBULATORY_CARE_PROVIDER_SITE_OTHER): Payer: BLUE CROSS/BLUE SHIELD | Admitting: Family Medicine
